# Patient Record
Sex: Male | Born: 1965 | Race: White | Hispanic: No | Marital: Married | Smoking: Never smoker
Health system: Southern US, Community
[De-identification: ages and names within clinical notes are randomized; demographics above are authoritative.]

## PROBLEM LIST (undated history)

## (undated) DIAGNOSIS — G40209 Localization-related (focal) (partial) symptomatic epilepsy and epileptic syndromes with complex partial seizures, not intractable, without status epilepticus: Secondary | ICD-10-CM

## (undated) DIAGNOSIS — T7840XA Allergy, unspecified, initial encounter: Secondary | ICD-10-CM

## (undated) DIAGNOSIS — E119 Type 2 diabetes mellitus without complications: Secondary | ICD-10-CM

## (undated) DIAGNOSIS — G43909 Migraine, unspecified, not intractable, without status migrainosus: Secondary | ICD-10-CM

## (undated) DIAGNOSIS — G4730 Sleep apnea, unspecified: Secondary | ICD-10-CM

## (undated) DIAGNOSIS — I219 Acute myocardial infarction, unspecified: Secondary | ICD-10-CM

## (undated) DIAGNOSIS — F419 Anxiety disorder, unspecified: Secondary | ICD-10-CM

## (undated) DIAGNOSIS — F32A Depression, unspecified: Secondary | ICD-10-CM

## (undated) DIAGNOSIS — K219 Gastro-esophageal reflux disease without esophagitis: Secondary | ICD-10-CM

## (undated) DIAGNOSIS — E785 Hyperlipidemia, unspecified: Secondary | ICD-10-CM

## (undated) HISTORY — DX: Acute myocardial infarction, unspecified: I21.9

## (undated) HISTORY — DX: Localization-related (focal) (partial) symptomatic epilepsy and epileptic syndromes with complex partial seizures, not intractable, without status epilepticus: G40.209

## (undated) HISTORY — DX: Anxiety disorder, unspecified: F41.9

## (undated) HISTORY — PX: SHOULDER SURGERY: SHX246

## (undated) HISTORY — PX: COLONOSCOPY: SHX174

## (undated) HISTORY — DX: Allergy, unspecified, initial encounter: T78.40XA

## (undated) HISTORY — DX: Migraine, unspecified, not intractable, without status migrainosus: G43.909

## (undated) HISTORY — DX: Depression, unspecified: F32.A

---

## 1988-08-16 DIAGNOSIS — Z77118 Contact with and (suspected) exposure to other environmental pollution: Secondary | ICD-10-CM | POA: Insufficient documentation

## 2012-01-17 DIAGNOSIS — F5221 Male erectile disorder: Secondary | ICD-10-CM | POA: Insufficient documentation

## 2012-01-17 DIAGNOSIS — N529 Male erectile dysfunction, unspecified: Secondary | ICD-10-CM | POA: Insufficient documentation

## 2013-10-27 DIAGNOSIS — M25522 Pain in left elbow: Secondary | ICD-10-CM | POA: Insufficient documentation

## 2013-10-27 DIAGNOSIS — M25512 Pain in left shoulder: Secondary | ICD-10-CM

## 2013-10-27 HISTORY — DX: Pain in left elbow: M25.522

## 2013-10-27 HISTORY — DX: Pain in left shoulder: M25.512

## 2013-12-10 DIAGNOSIS — M7542 Impingement syndrome of left shoulder: Secondary | ICD-10-CM | POA: Insufficient documentation

## 2014-03-30 DIAGNOSIS — Z9889 Other specified postprocedural states: Secondary | ICD-10-CM | POA: Insufficient documentation

## 2014-07-28 ENCOUNTER — Emergency Department (HOSPITAL_COMMUNITY): Payer: Managed Care, Other (non HMO)

## 2014-07-28 ENCOUNTER — Encounter (HOSPITAL_COMMUNITY): Payer: Self-pay | Admitting: Neurology

## 2014-07-28 ENCOUNTER — Emergency Department (HOSPITAL_COMMUNITY)
Admission: EM | Admit: 2014-07-28 | Discharge: 2014-07-28 | Disposition: A | Discharge status: Home / Self Care | Payer: Managed Care, Other (non HMO) | Attending: Emergency Medicine | Admitting: Emergency Medicine

## 2014-07-28 DIAGNOSIS — R51 Headache: Secondary | ICD-10-CM | POA: Diagnosis not present

## 2014-07-28 DIAGNOSIS — Z8639 Personal history of other endocrine, nutritional and metabolic disease: Secondary | ICD-10-CM | POA: Diagnosis not present

## 2014-07-28 DIAGNOSIS — R42 Dizziness and giddiness: Secondary | ICD-10-CM | POA: Diagnosis present

## 2014-07-28 DIAGNOSIS — Z880 Allergy status to penicillin: Secondary | ICD-10-CM | POA: Diagnosis not present

## 2014-07-28 DIAGNOSIS — R260 Ataxic gait: Secondary | ICD-10-CM | POA: Insufficient documentation

## 2014-07-28 DIAGNOSIS — E119 Type 2 diabetes mellitus without complications: Secondary | ICD-10-CM | POA: Insufficient documentation

## 2014-07-28 DIAGNOSIS — Z8719 Personal history of other diseases of the digestive system: Secondary | ICD-10-CM | POA: Diagnosis not present

## 2014-07-28 HISTORY — DX: Gastro-esophageal reflux disease without esophagitis: K21.9

## 2014-07-28 HISTORY — DX: Type 2 diabetes mellitus without complications: E11.9

## 2014-07-28 HISTORY — DX: Hyperlipidemia, unspecified: E78.5

## 2014-07-28 LAB — COMPREHENSIVE METABOLIC PANEL
ALBUMIN: 4.3 g/dL (ref 3.5–5.0)
ALK PHOS: 85 U/L (ref 38–126)
ALT: 8 U/L — AB (ref 17–63)
ANION GAP: 11 (ref 5–15)
AST: 14 U/L — ABNORMAL LOW (ref 15–41)
BILIRUBIN TOTAL: 0.3 mg/dL (ref 0.3–1.2)
BUN: 12 mg/dL (ref 6–20)
CO2: 24 mmol/L (ref 22–32)
CREATININE: 0.84 mg/dL (ref 0.61–1.24)
Calcium: 10 mg/dL (ref 8.9–10.3)
Chloride: 103 mmol/L (ref 101–111)
GFR calc Af Amer: >60 mL/min (ref >60)
GFR calc non Af Amer: >60 mL/min (ref >60)
GLUCOSE: 147 mg/dL — AB (ref 65–99)
POTASSIUM: 4.3 mmol/L (ref 3.5–5.1)
Sodium: 138 mmol/L (ref 135–145)
Total Protein: 7.5 g/dL (ref 6.5–8.1)

## 2014-07-28 LAB — CBC
HCT: 42.8 % (ref 39.0–52.0)
Hemoglobin: 14.6 g/dL (ref 13.0–17.0)
MCH: 30.3 pg (ref 26.0–34.0)
MCHC: 34.1 g/dL (ref 30.0–36.0)
MCV: 88.8 fL (ref 78.0–100.0)
PLATELETS: 277 10*3/uL (ref 150–400)
RBC: 4.82 MIL/uL (ref 4.22–5.81)
RDW: 12.3 % (ref 11.5–15.5)
WBC: 7.4 10*3/uL (ref 4.0–10.5)

## 2014-07-28 LAB — CBG MONITORING, ED: GLUCOSE-CAPILLARY: 135 mg/dL — AB (ref 65–99)

## 2014-07-28 LAB — I-STAT TROPONIN, ED: Troponin i, poc: 0 ng/mL (ref 0.00–0.08)

## 2014-07-28 NOTE — Discharge Instructions (Signed)

## 2014-07-28 NOTE — ED Provider Notes (Signed)
CSN: [id number]     Arrival date & time 07/28/14  1603 History   First MD Initiated Contact with Patient 07/28/14 1822     Chief Complaint  Patient presents with  . Dizziness    (Consider location/radiation/quality/duration/timing/severity/associated sxs/prior Treatment) Patient is a 49 y.o. male presenting with dizziness. The history is provided by the patient.  Dizziness Quality:  Lightheadedness and room spinning Severity:  Mild Onset quality:  Gradual Timing:  Intermittent Progression:  Waxing and waning Chronicity:  New Context: inactivity   Relieved by:  Nothing Worsened by:  Nothing Ineffective treatments:  None tried Associated symptoms: headaches   Associated symptoms: no blood in stool, no chest pain, no hearing loss, no nausea, no palpitations, no shortness of breath, no syncope, no vision changes, no vomiting and no weakness   Risk factors: no heart disease, no hx of stroke, no hx of vertigo and no new medications     Past Medical History  Diagnosis Date  . Diabetes mellitus without complication   . GERD (gastroesophageal reflux disease)   . Hyperlipemia    History reviewed. No pertinent past surgical history. No family history on file. History  Substance Use Topics  . Smoking status: Never Smoker   . Smokeless tobacco: Not on file  . Alcohol Use: No    Review of Systems  Constitutional: Negative for fever and fatigue.  HENT: Negative for hearing loss.   Eyes: Negative for visual disturbance.  Respiratory: Negative for chest tightness and shortness of breath.   Cardiovascular: Negative for chest pain, palpitations and syncope.  Gastrointestinal: Negative for nausea, vomiting, abdominal pain and blood in stool.  Musculoskeletal: Positive for gait problem. Negative for myalgias and neck pain.  Neurological: Positive for dizziness and headaches. Negative for weakness and light-headedness.  Psychiatric/Behavioral: Negative for confusion.  All other systems  reviewed and are negative.     Allergies  Doxycycline; Penicillins; and Tetanus toxoids  Home Medications   Prior to Admission medications   Not on File   BP 124/79 mmHg  Pulse 89  Temp(Src) 98.2 F (36.8 C) (Oral)  Resp 16  SpO2 96%   Physical Exam  Constitutional: He is oriented to person, place, and time. He appears well-developed and well-nourished. He does not appear ill. No distress.  HENT:  Head: Normocephalic and atraumatic.  Nose: Nose normal.  Mouth/Throat: Oropharynx is clear and moist. No oropharyngeal exudate.  Eyes: EOM are normal. Pupils are equal, round, and reactive to light.  No nystagmus at rest or with provocative maneuvers   Neck: Normal range of motion. Neck supple.  Cardiovascular: Normal rate, regular rhythm, normal heart sounds and intact distal pulses.   No murmur heard. Pulmonary/Chest: Effort normal and breath sounds normal. No respiratory distress. He has no wheezes. He exhibits no tenderness.  Abdominal: Soft. He exhibits no distension. There is no tenderness. There is no guarding.  Musculoskeletal: Normal range of motion. He exhibits no tenderness.  Neurological: He is alert and oriented to person, place, and time. No cranial nerve deficit. Coordination normal.  Patient is awake, alert, and oriented x 3.  Clear and fluent speech without deficits.  No facial droop.  No nystagmus.  No reproducible dizziness with shaking head or with rapid eye movement.  Full strength, sensation, coordination (rapid alternating hand movements, rapid finger movements, finger to nose, and heel to shin testing).  Stable gait.    Skin: Skin is warm and dry. He is not diaphoretic. No pallor.  Psychiatric: He has a  normal mood and affect. His behavior is normal. Judgment and thought content normal.  Nursing note and vitals reviewed.   ED Course  Procedures (including critical care time) Labs Review Labs Reviewed  COMPREHENSIVE METABOLIC PANEL - Abnormal; Notable for  the following:    Glucose, Bld 147 (*)    AST 14 (*)    ALT 8 (*)    All other components within normal limits  CBG MONITORING, ED - Abnormal; Notable for the following:    Glucose-Capillary 135 (*)    All other components within normal limits  CBC  I-STAT TROPOININ, ED    Imaging Review No results found.   EKG Interpretation   Date/Time:  Tuesday July 28 2014 16:43:31 EDT Ventricular Rate:  81 PR Interval:  144 QRS Duration: 74 QT Interval:  354 QTC Calculation: 411 R Axis:   60 Text Interpretation:  Normal sinus rhythm Normal ECG Sinus rhythm Normal  ECG Confirmed by Carmin Muskrat  MD ([id number]) on 07/28/2014 4:57:05 PM      MDM   Final diagnoses:  Ataxic gait  Lightheadedness   Pt is a 49 yo M with hx of HTN and DM who presents with several days of dizziness, lightheadedness and feeling mentally foggy.  Was at the [place] with family 4 days ago when he developed acute onset of lightheadedness and felt diffusely fatigued.  Has had intermittent "head tightness" but denies frank headaches with the sx.  No chest pain, palpitations, SOB, vision changes, or nausea.  He denies EtOH or drug use prior, no recent meds, no recent trauma, no hx of significant falls.  He took a long nap and the sensation resolved for a while but has been waxing and waning for the few days since.  He also endorses intermittent room spinning dizziness with walking and ataxic gait where he has walked into several things during this time.    Patient is awake, alert, and oriented x 3.  Able to provide his own history.  Clear and fluent speech without deficits.  No facial droop.  No nystagmus.  No reproducible dizziness with shaking head or with rapid eye movement.  Full strength, sensation, coordination (rapid alternating hand movements, rapid finger movements, finger to nose, and heel to shin testing).  Stable gait.    Due to history, will obtain CT head and labs to evaluate further.  Concerned for possible  posterior circulation stroke.  Will get CT head then MRI if needed.    CT head with no acute changes.  Minor microvascular infarcts.   Labs and EKG overall wnl.    MRI brain also benign.   Advised of the results and all questions were answered. Considered ok for dc as there is no evidence of acute intracranial pathology, vitals have been stable throughout, no evidence of arrhythmia seen, and no cardiac ischemia noted.   Exam not consistent with peripheral vertigo.  Advised good hydration and close PCP follow up.  If sx continue, may benefit from holter monitoring or further cardiac work up.  All questions were answered and ED return precautions were discussed prior to dc home in stable condition.   Labs, EKGs, and imaging were reviewed and interpreted by myself and my attending, and incorporated in the medical decision making.  Patient was seen with ED Attending, Dr. Maryjean Ka, MD    Tori Milks, MD 24/26/83 [id number]  David Glick, MD 22/29/79 8921

## 2014-07-28 NOTE — ED Notes (Signed)
Pt transported to MRI 

## 2014-07-28 NOTE — ED Notes (Addendum)
Pt is here with wife on Friday he went fishing, during the trip he was stumbling and feeling dizzy. When he got home, went to sleep in chair and doesn't remember anything that happened. Dizziness has continued, feels like he is in a box. Blood sugars have been high over weekend. Today cbg 288. Wife at bedside reports he is slurring his words. Pt is a x 4. Coming from [place] office for confusion and altered mental status but this is over weekend.

## 2014-07-28 NOTE — ED Notes (Signed)
ED Doctor at bedside.  

## 2014-07-31 ENCOUNTER — Ambulatory Visit (INDEPENDENT_AMBULATORY_CARE_PROVIDER_SITE_OTHER): Payer: Managed Care, Other (non HMO) | Admitting: Endocrinology

## 2014-07-31 ENCOUNTER — Encounter: Payer: Self-pay | Admitting: Endocrinology

## 2014-07-31 VITALS — BP 128/70 | HR 98 | Temp 97.8°F | Ht 69.5 in | Wt 223.0 lb

## 2014-07-31 DIAGNOSIS — E1142 Type 2 diabetes mellitus with diabetic polyneuropathy: Secondary | ICD-10-CM | POA: Diagnosis not present

## 2014-07-31 DIAGNOSIS — F329 Major depressive disorder, single episode, unspecified: Secondary | ICD-10-CM | POA: Diagnosis not present

## 2014-07-31 DIAGNOSIS — E785 Hyperlipidemia, unspecified: Secondary | ICD-10-CM | POA: Insufficient documentation

## 2014-07-31 DIAGNOSIS — F32A Depression, unspecified: Secondary | ICD-10-CM

## 2014-07-31 DIAGNOSIS — F339 Major depressive disorder, recurrent, unspecified: Secondary | ICD-10-CM | POA: Insufficient documentation

## 2014-07-31 MED ORDER — INSULIN DETEMIR 100 UNIT/ML FLEXPEN: 140.0000 [IU] | PEN_INJECTOR | SUBCUTANEOUS | Status: DC

## 2014-07-31 NOTE — Patient Instructions (Addendum)
good diet and exercise significantly improve the control of your diabetes.  please let me know if you wish to be referred to a dietician.  high blood sugar is very risky to your health.  you should see an eye doctor and dentist every year.  It is very important to get all recommended vaccinations.  controlling your blood pressure and cholesterol drastically reduces the damage diabetes does to your body.  Those who smoke should quit.  please discuss these with your doctor.  check your blood sugar twice a day.  vary the time of day when you check, between before the 3 meals, and at bedtime.  also check if you have symptoms of your blood sugar being too high or too low.  please keep a record of the readings and bring it to your next appointment here.  You can write it on any piece of paper.  please call us sooner if your blood sugar goes below 70, or if you have a lot of readings over 200.   For now, please stop taking the novolog, and: Take levemir, 140 units each morning.  Please call next week, to tell us how the blood sugar is doing.   Please come back for a follow-up appointment in 2 months.

## 2014-07-31 NOTE — Progress Notes (Signed)
Subjective:    Patient ID: Devin Lucero, male    DOB: [birth year], 49 y.o.   MRN: [id number]  HPI pt states DM was dx'ed in 2008; he has moderate neuropathy of the lower extremities; he is unaware of any associated chronic complications; he has been on insulin since 2010; pt says his diet and exercise are good; he has never had pancreatitis, severe hypoglycemia or DKA.  He takes levemir qhs, and says he never misses.   He averages approx 1 dose of novolog per day.  He says cbg's vary from 130-500.  It is in general higher as the day goes on.  He says he has seen multiple providers for his DM, but a1c has stayed consistently very high.  Past Medical History  Diagnosis Date  . Diabetes mellitus without complication   . GERD (gastroesophageal reflux disease)   . Hyperlipemia     No past surgical history on file.  History   Social History  . Marital Status: Married    Spouse Name: N/A  . Number of Children: N/A  . Years of Education: N/A   Occupational History  . Not on file.   Social History Main Topics  . Smoking status: Never Smoker   . Smokeless tobacco: Not on file  . Alcohol Use: No  . Drug Use: Not on file  . Sexual Activity: Not on file   Other Topics Concern  . Not on file   Social History Narrative    Current Outpatient Prescriptions on File Prior to Visit  Medication Sig Dispense Refill  . citalopram (CELEXA) 40 MG tablet Take 40 mg by mouth daily.    Marland Kitchen omeprazole (PRILOSEC) 20 MG capsule Take 20 mg by mouth daily.    . pregabalin (LYRICA) 50 MG capsule Take 50 mg by mouth 2 (two) times daily.     No current facility-administered medications on file prior to visit.    Allergies  Allergen Reactions  . Penicillins Rash  . Doxycycline Itching and Other (See Comments)    flushing  . Tetanus Toxoids Rash    Family History  Problem Relation Age of Onset  . Diabetes Paternal Grandmother     BP 128/70 mmHg  Pulse 98  Temp(Src) 97.8 F (36.6 C) (Oral)   Ht 5' 9.5" (1.765 m)  Wt 223 lb (101.152 kg)  BMI 32.47 kg/m2  SpO2 94%    Review of Systems denies weight loss, blurry vision, chest pain, sob, n/v, urinary frequency, muscle cramps, excessive diaphoresis, cold intolerance, rhinorrhea, and easy bruising.  He has chronic headache and depression.     Objective:   Physical Exam VS: see vs page GEN: no distress HEAD: head: no deformity eyes: no periorbital swelling, no proptosis external nose and ears are normal mouth: no lesion seen NECK: supple, thyroid is not enlarged CHEST WALL: no deformity LUNGS: clear to auscultation. BREASTS:  No gynecomastia. CV: reg rate and rhythm, no murmur. ABD: abdomen is soft, nontender.  no hepatosplenomegaly.  not distended.  Large self-reducing ventral hernia.  MUSCULOSKELETAL: muscle bulk and strength are grossly normal.  no obvious joint swelling.  gait is normal and steady EXTEMITIES: no deformity.  no ulcer on the feet.  feet are of normal color and temp.  Trace bilat leg edema PULSES: dorsalis pedis intact bilat.  no carotid bruit NEURO:  cn 2-12 grossly intact.   readily moves all 4's.  sensation is intact to touch on the feet, but decreased from normal SKIN:  Normal texture  and temperature.  No rash or suspicious lesion is visible.   NODES:  None palpable at the neck PSYCH: alert, well-oriented.  Does not appear anxious nor depressed.   i have reviewed outside records: pt was seen on 07/03/14.  Glycemic control was poor, and insulin pump rx is considered.  Labs: a1c=12.2%  i personally reviewed electrocardiogram tracing (07/28/14): normal    Assessment & Plan:  DM: severe exacerbation.  In this setting, he should have achievable goals for glycemic control, and a simple insulin schedule, at least for now.  Obesity: new to me.  this complicates the rx of DM.  Patient is advised the following: Patient Instructions  good diet and exercise significantly improve the control of your  diabetes.  please let me know if you wish to be referred to a dietician.  high blood sugar is very risky to your health.  you should see an eye doctor and dentist every year.  It is very important to get all recommended vaccinations.  controlling your blood pressure and cholesterol drastically reduces the damage diabetes does to your body.  Those who smoke should quit.  please discuss these with your doctor.  check your blood sugar twice a day.  vary the time of day when you check, between before the 3 meals, and at bedtime.  also check if you have symptoms of your blood sugar being too high or too low.  please keep a record of the readings and bring it to your next appointment here.  You can write it on any piece of paper.  please call us sooner if your blood sugar goes below 70, or if you have a lot of readings over 200.   For now, please stop taking the novolog, and: Take levemir, 140 units each morning.  Please call next week, to tell us how the blood sugar is doing.   Please come back for a follow-up appointment in 2 months.

## 2014-08-04 ENCOUNTER — Telehealth: Payer: Self-pay | Admitting: Endocrinology

## 2014-08-04 NOTE — Telephone Encounter (Signed)
Did the pt state when his readings were high? Specific meals or days?

## 2014-08-04 NOTE — Telephone Encounter (Signed)
No he did not

## 2014-08-04 NOTE — Telephone Encounter (Signed)
Pt's BS levels are staying level but high at 220-250

## 2014-09-17 ENCOUNTER — Telehealth: Payer: Self-pay | Admitting: Endocrinology

## 2014-09-18 NOTE — Telephone Encounter (Signed)
error 

## 2014-10-02 ENCOUNTER — Encounter: Payer: Self-pay | Admitting: Endocrinology

## 2014-10-02 ENCOUNTER — Ambulatory Visit (INDEPENDENT_AMBULATORY_CARE_PROVIDER_SITE_OTHER): Payer: Managed Care, Other (non HMO) | Admitting: Endocrinology

## 2014-10-02 VITALS — BP 137/88 | HR 76 | Temp 98.2°F | Ht 69.5 in | Wt 222.0 lb

## 2014-10-02 DIAGNOSIS — E1142 Type 2 diabetes mellitus with diabetic polyneuropathy: Secondary | ICD-10-CM

## 2014-10-02 LAB — POCT GLYCOSYLATED HEMOGLOBIN (HGB A1C): Hemoglobin A1C: 11.2

## 2014-10-02 MED ORDER — INSULIN DETEMIR 100 UNIT/ML FLEXPEN: 170.0000 [IU] | PEN_INJECTOR | SUBCUTANEOUS | Status: DC

## 2014-10-02 NOTE — Patient Instructions (Addendum)
check your blood sugar twice a day.  vary the time of day when you check, between before the 3 meals, and at bedtime.  also check if you have symptoms of your blood sugar being too high or too low.  please keep a record of the readings and bring it to your next appointment here.  You can write it on any piece of paper.  please call us sooner if your blood sugar goes below 70, or if you have a lot of readings over 200.   Please increase the levemir to 170 units each morning.  Please come back for a follow-up appointment in 2 months.

## 2014-10-02 NOTE — Progress Notes (Signed)
   Subjective:    Patient ID: Devin Lucero, male    DOB: 06/30/1965, 49 y.o.   MRN: [id number]  HPI Pt returns for f/u of diabetes mellitus: DM type: Insulin-requiring type 2 Dx'ed: 8756 Complications: polyneuropathy Therapy: insulin since 2010 DKA: never Severe hypoglycemia: never Pancreatitis: never Other: he needs a qd insulin regimen, as he has had poor results with multiple daily injections in the past.   Interval history: no cbg record, but states cbg's are in the 139-200's.  It is in general higher as the day goes on.  pt states he feels well in general.   Past Medical History  Diagnosis Date  . Diabetes mellitus without complication   . GERD (gastroesophageal reflux disease)   . Hyperlipemia     No past surgical history on file.  Social History   Social History  . Marital Status: Married    Spouse Name: N/A  . Number of Children: N/A  . Years of Education: N/A   Occupational History  . Not on file.   Social History Main Topics  . Smoking status: Never Smoker   . Smokeless tobacco: Not on file  . Alcohol Use: No  . Drug Use: Not on file  . Sexual Activity: Not on file   Other Topics Concern  . Not on file   Social History Narrative    Current Outpatient Prescriptions on File Prior to Visit  Medication Sig Dispense Refill  . citalopram (CELEXA) 40 MG tablet Take 40 mg by mouth daily.    Marland Kitchen omeprazole (PRILOSEC) 20 MG capsule Take 20 mg by mouth daily.    . pregabalin (LYRICA) 50 MG capsule Take 50 mg by mouth 2 (two) times daily.     No current facility-administered medications on file prior to visit.    Allergies  Allergen Reactions  . Penicillins Rash  . Doxycycline Itching and Other (See Comments)    flushing  . Tetanus Toxoids Rash    Family History  Problem Relation Age of Onset  . Diabetes Paternal Grandmother     BP 137/88 mmHg  Pulse 76  Temp(Src) 98.2 F (36.8 C) (Oral)  Ht 5' 9.5" (1.765 m)  Wt 222 lb (100.699 kg)  BMI 32.32  kg/m2  SpO2 97%  Review of Systems He denies hypoglycemia    Objective:   Physical Exam VITAL SIGNS:  See vs page GENERAL: no distress Pulses: dorsalis pedis intact bilat.   MSK: no deformity of the feet CV: no leg edema Skin:  no ulcer on the feet.  normal color and temp on the feet. Neuro: sensation is intact to touch on the feet    A1c=11.2%    Assessment & Plan:  DM: he needs increased rx.  Patient is advised the following: Patient Instructions  check your blood sugar twice a day.  vary the time of day when you check, between before the 3 meals, and at bedtime.  also check if you have symptoms of your blood sugar being too high or too low.  please keep a record of the readings and bring it to your next appointment here.  You can write it on any piece of paper.  please call us sooner if your blood sugar goes below 70, or if you have a lot of readings over 200.   Please increase the levemir to 170 units each morning.  Please come back for a follow-up appointment in 2 months.

## 2014-10-23 ENCOUNTER — Telehealth: Payer: Self-pay | Admitting: Endocrinology

## 2014-10-23 MED ORDER — INSULIN ISOPHANE HUMAN 100 UNIT/ML KWIKPEN: 130.0000 [IU] | PEN_INJECTOR | SUBCUTANEOUS | Status: DC

## 2014-10-23 NOTE — Telephone Encounter (Signed)
See note below and please advise, Thanks! 

## 2014-10-23 NOTE — Telephone Encounter (Signed)
i have sent a prescription to your pharmacy, to change to NPH. This is not a unit-for-unit conversion, so this is probably not enough Please call next week to report cbg's

## 2014-10-23 NOTE — Telephone Encounter (Signed)
Pt letting us know the injection spots where he gives himself the insulin is red and has fever in it

## 2014-10-26 NOTE — Telephone Encounter (Signed)
Patient advised of note below and voiced understanding.  

## 2014-12-04 ENCOUNTER — Ambulatory Visit (INDEPENDENT_AMBULATORY_CARE_PROVIDER_SITE_OTHER): Payer: Managed Care, Other (non HMO) | Admitting: Endocrinology

## 2014-12-04 ENCOUNTER — Encounter: Payer: Self-pay | Admitting: Endocrinology

## 2014-12-04 VITALS — BP 118/84 | HR 82 | Temp 98.7°F | Ht 69.5 in | Wt 229.0 lb

## 2014-12-04 DIAGNOSIS — E1142 Type 2 diabetes mellitus with diabetic polyneuropathy: Secondary | ICD-10-CM

## 2014-12-04 LAB — POCT GLYCOSYLATED HEMOGLOBIN (HGB A1C): Hemoglobin A1C: 11.2

## 2014-12-04 MED ORDER — INSULIN ISOPHANE HUMAN 100 UNIT/ML KWIKPEN: 160.0000 [IU] | PEN_INJECTOR | SUBCUTANEOUS | Status: DC

## 2014-12-04 NOTE — Progress Notes (Signed)
Subjective:    Patient ID: Devin Lucero, male    DOB: March 03, 1965, 49 y.o.   MRN: [id number]  HPI Pt returns for f/u of diabetes mellitus: DM type: Insulin-requiring type 2 Dx'ed: [id number] Complications: polyneuropathy Therapy: insulin since 2010.  DKA: never Severe hypoglycemia: never Pancreatitis: never Other: he needs a qd insulin regimen, as he has had poor results with multiple daily injections in the past.  When he took levemir, he had am hypoglycemia and pm hyperglycemia.   Interval history: no cbg record, but states cbg's are in the 150-200's.  It is in general higher as the day goes on.  pt states he feels well in general.  He takes 130 units qam.  Past Medical History  Diagnosis Date  . Diabetes mellitus without complication ([place])   . GERD (gastroesophageal reflux disease)   . Hyperlipemia     No past surgical history on file.  Social History   Social History  . Marital Status: Married    Spouse Name: N/A  . Number of Children: N/A  . Years of Education: N/A   Occupational History  . Not on file.   Social History Main Topics  . Smoking status: Never Smoker   . Smokeless tobacco: Not on file  . Alcohol Use: No  . Drug Use: Not on file  . Sexual Activity: Not on file   Other Topics Concern  . Not on file   Social History Narrative    Current Outpatient Prescriptions on File Prior to Visit  Medication Sig Dispense Refill  . citalopram (CELEXA) 40 MG tablet Take 40 mg by mouth daily.    Marland Kitchen omeprazole (PRILOSEC) 20 MG capsule Take 20 mg by mouth daily.    . pregabalin (LYRICA) 50 MG capsule Take 50 mg by mouth 2 (two) times daily.     No current facility-administered medications on file prior to visit.    Allergies  Allergen Reactions  . Penicillins Rash  . Doxycycline Itching and Other (See Comments)    flushing  . Tetanus Toxoids Rash    Family History  Problem Relation Age of Onset  . Diabetes Paternal Grandmother     BP 118/84 mmHg  Pulse  82  Temp(Src) 98.7 F (37.1 C) (Oral)  Ht 5' 9.5" (1.765 m)  Wt 229 lb (103.874 kg)  BMI 33.34 kg/m2  SpO2 96%  Review of Systems He denies hypoglycemia and weight change    Objective:   Physical Exam VITAL SIGNS:  See vs page GENERAL: no distress SKIN:  Insulin injection sites at the anterior abdomen are normal.    a1c=11.2%    Assessment & Plan:  DM: he needs increased rx.  Patient is advised the following: Patient Instructions  check your blood sugar twice a day.  vary the time of day when you check, between before the 3 meals, and at bedtime.  also check if you have symptoms of your blood sugar being too high or too low.  please keep a record of the readings and bring it to your next appointment here.  You can write it on any piece of paper.  please call us sooner if your blood sugar goes below 70, or if you have a lot of readings over 200.   Please increase the NPH insulin to 160 units each morning.  Please call in 2 weeks, to tell us how the blood sugar is doing.   Please come back for a follow-up appointment in 2 months.

## 2014-12-04 NOTE — Patient Instructions (Addendum)
check your blood sugar twice a day.  vary the time of day when you check, between before the 3 meals, and at bedtime.  also check if you have symptoms of your blood sugar being too high or too low.  please keep a record of the readings and bring it to your next appointment here.  You can write it on any piece of paper.  please call us sooner if your blood sugar goes below 70, or if you have a lot of readings over 200.   Please increase the NPH insulin to 160 units each morning.  Please call in 2 weeks, to tell us how the blood sugar is doing.   Please come back for a follow-up appointment in 2 months.

## 2014-12-08 ENCOUNTER — Telehealth: Payer: Self-pay | Admitting: Endocrinology

## 2014-12-08 MED ORDER — GLUCOSE BLOOD VI STRP: ORAL_STRIP | Status: DC

## 2014-12-08 NOTE — Telephone Encounter (Signed)
Patient need a refill of test strips, for the meter one touch ultra, send to  [facility] - [place], [place] [phone] (Phone) [phone] (Fax)

## 2014-12-08 NOTE — Telephone Encounter (Signed)
rx submitted per pt's request.  

## 2015-02-05 ENCOUNTER — Ambulatory Visit: Payer: Managed Care, Other (non HMO) | Admitting: Endocrinology

## 2015-02-19 ENCOUNTER — Ambulatory Visit: Payer: Managed Care, Other (non HMO) | Admitting: Endocrinology

## 2015-03-05 ENCOUNTER — Ambulatory Visit: Payer: Managed Care, Other (non HMO) | Admitting: Endocrinology

## 2015-03-12 ENCOUNTER — Ambulatory Visit (INDEPENDENT_AMBULATORY_CARE_PROVIDER_SITE_OTHER): Payer: Managed Care, Other (non HMO) | Admitting: Endocrinology

## 2015-03-12 ENCOUNTER — Encounter: Payer: Self-pay | Admitting: Endocrinology

## 2015-03-12 VITALS — BP 146/82 | HR 77 | Temp 98.2°F | Wt 229.0 lb

## 2015-03-12 DIAGNOSIS — E119 Type 2 diabetes mellitus without complications: Secondary | ICD-10-CM

## 2015-03-12 DIAGNOSIS — E1142 Type 2 diabetes mellitus with diabetic polyneuropathy: Secondary | ICD-10-CM | POA: Diagnosis not present

## 2015-03-12 DIAGNOSIS — Z794 Long term (current) use of insulin: Secondary | ICD-10-CM

## 2015-03-12 LAB — POCT GLYCOSYLATED HEMOGLOBIN (HGB A1C): Hemoglobin A1C: 12

## 2015-03-12 MED ORDER — INSULIN LISPRO PROT & LISPRO (75-25 MIX) 100 UNIT/ML KWIKPEN: 160.0000 [IU] | PEN_INJECTOR | SUBCUTANEOUS | Status: DC

## 2015-03-12 NOTE — Patient Instructions (Addendum)
check your blood sugar twice a day.  vary the time of day when you check, between before the 3 meals, and at bedtime.  also check if you have symptoms of your blood sugar being too high or too low.  please keep a record of the readings and bring it to your next appointment here.  You can write it on any piece of paper.  please call us sooner if your blood sugar goes below 70, or if you have a lot of readings over 200.   Please change the NPH insulin to "75/25," 160 units with breakfast.  i have sent a prescription to your pharmacy.   On this type of insulin schedule, you should eat meals on a regular schedule.  If a meal is missed or significantly delayed, your blood sugar could go low. Please come back for a follow-up appointment in 2 months.

## 2015-03-12 NOTE — Progress Notes (Signed)
Pre visit review using our clinic review tool, if applicable. No additional management support is needed unless otherwise documented below in the visit note. 

## 2015-03-12 NOTE — Progress Notes (Signed)
Subjective:    Patient ID: Devin Lucero, male    DOB: [birth year], 50 y.o.   MRN: [id number]  HPI Pt returns for f/u of diabetes mellitus: DM type: Insulin-requiring type 2 Dx'ed: [id number] Complications: polyneuropathy Therapy: insulin since 2010.  DKA: never Severe hypoglycemia: never Pancreatitis: never Other: he needs a qd insulin regimen, as he has had poor results with multiple daily injections in the past.  When he took levemir, he had am hypoglycemia and pm hyperglycemia.   Interval history: Pt says he had to go to [place] for the past week on a family emergency, and did not take his insulin then.  Prior to the trip, he says cbg's were in the low-100's.  He had only 1 episode of hypoglycemia (54).  This was fasting.   Past Medical History  Diagnosis Date  . Diabetes mellitus without complication ([place])   . GERD (gastroesophageal reflux disease)   . Hyperlipemia     No past surgical history on file.  Social History   Social History  . Marital Status: Married    Spouse Name: N/A  . Number of Children: N/A  . Years of Education: N/A   Occupational History  . Not on file.   Social History Main Topics  . Smoking status: Never Smoker   . Smokeless tobacco: Not on file  . Alcohol Use: No  . Drug Use: Not on file  . Sexual Activity: Not on file   Other Topics Concern  . Not on file   Social History Narrative    Current Outpatient Prescriptions on File Prior to Visit  Medication Sig Dispense Refill  . glucose blood (ONE TOUCH ULTRA TEST) test strip Use to check blood sugar 2 times per day 100 each 5  . omeprazole (PRILOSEC) 20 MG capsule Take 20 mg by mouth daily.     No current facility-administered medications on file prior to visit.    Allergies  Allergen Reactions  . Penicillins Rash  . Doxycycline Itching and Other (See Comments)    flushing  . Tetanus Toxoids Rash    Family History  Problem Relation Age of Onset  . Diabetes Paternal Grandmother      BP 146/82 mmHg  Pulse 77  Temp(Src) 98.2 F (36.8 C) (Oral)  Wt 229 lb (103.874 kg)  SpO2 97%  Review of Systems Denies LOC    Objective:   Physical Exam VITAL SIGNS:  See vs page GENERAL: no distress Pulses: dorsalis pedis intact bilat.   MSK: no deformity of the feet CV: no leg edema Skin:  no ulcer on the feet.  normal color and temp on the feet. Neuro: sensation is intact to touch on the feet.     A1c=12%    Assessment & Plan:  DM: worse.    Noncompliance with cbg recording and insulin: he needs to continue insulin just QD, but the pattern of his cbg's indicates he needs a faster-acting qd insulin.     Patient is advised the following: Patient Instructions  check your blood sugar twice a day.  vary the time of day when you check, between before the 3 meals, and at bedtime.  also check if you have symptoms of your blood sugar being too high or too low.  please keep a record of the readings and bring it to your next appointment here.  You can write it on any piece of paper.  please call us sooner if your blood sugar goes below 70, or if you  have a lot of readings over 200.   Please change the NPH insulin to "75/25," 160 units with breakfast.  i have sent a prescription to your pharmacy.   On this type of insulin schedule, you should eat meals on a regular schedule.  If a meal is missed or significantly delayed, your blood sugar could go low. Please come back for a follow-up appointment in 2 months.

## 2015-03-14 DIAGNOSIS — E1169 Type 2 diabetes mellitus with other specified complication: Secondary | ICD-10-CM | POA: Insufficient documentation

## 2015-03-14 DIAGNOSIS — E1140 Type 2 diabetes mellitus with diabetic neuropathy, unspecified: Secondary | ICD-10-CM | POA: Insufficient documentation

## 2015-03-14 DIAGNOSIS — E119 Type 2 diabetes mellitus without complications: Secondary | ICD-10-CM | POA: Insufficient documentation

## 2015-03-16 ENCOUNTER — Telehealth: Payer: Self-pay | Admitting: Endocrinology

## 2015-03-16 NOTE — Telephone Encounter (Addendum)
See note below and please advise. Pt will not be able and pick his new insulin up.

## 2015-03-16 NOTE — Telephone Encounter (Signed)
Options are humalog 75/25, novolog 70/30, or [facility] 70/30. Please let me know which one is cheapest for you.

## 2015-03-16 NOTE — Telephone Encounter (Signed)
See note below and please advise, Thanks! 

## 2015-03-16 NOTE — Telephone Encounter (Signed)
I contacted the pt and advised to increase his old insulin to 180 units. Pt voiced understanding. Since the humalog is not covered under his insurance should the pt stay on the medication he is currently taking? Please advise, Thanks!

## 2015-03-16 NOTE — Telephone Encounter (Signed)
PT said he hasn't gotten new insulin yet because of insurance and the past two days his sugar levels have been around 216-356 and he also said that he has had the feeling as if he is "coming off of anesthesia" as well.

## 2015-03-16 NOTE — Telephone Encounter (Signed)
Pt states the humalog requires a prior auth please keep him updated on the status once submitted

## 2015-03-16 NOTE — Telephone Encounter (Signed)
To use up the old insulin, increase to 180 units qam

## 2015-03-17 MED ORDER — INSULIN ASPART PROT & ASPART (70-30 MIX) 100 UNIT/ML PEN: 160.0000 [IU] | PEN_INJECTOR | Freq: Every day | SUBCUTANEOUS | Status: DC

## 2015-03-17 NOTE — Telephone Encounter (Signed)
Pt will call our office back if he has any issues picking the med up.

## 2015-03-17 NOTE — Telephone Encounter (Signed)
Novolog 70/30 is the alternative brand to the humalog 75/25. Rx for novolog 70/30 has been sent in place of the humalog 75/25.

## 2015-03-17 NOTE — Addendum Note (Signed)
Addended by: Verlin Grills T on: 03/17/2015 08:43 AM   Modules accepted: Orders, Medications

## 2015-05-07 ENCOUNTER — Ambulatory Visit: Payer: Managed Care, Other (non HMO) | Admitting: Endocrinology

## 2015-05-21 ENCOUNTER — Ambulatory Visit (INDEPENDENT_AMBULATORY_CARE_PROVIDER_SITE_OTHER): Payer: Managed Care, Other (non HMO) | Admitting: Endocrinology

## 2015-05-21 ENCOUNTER — Encounter: Payer: Self-pay | Admitting: Endocrinology

## 2015-05-21 ENCOUNTER — Telehealth: Payer: Self-pay | Admitting: Endocrinology

## 2015-05-21 VITALS — BP 130/82 | HR 82 | Temp 98.1°F | Resp 16 | Ht 69.5 in | Wt 225.4 lb

## 2015-05-21 DIAGNOSIS — E1142 Type 2 diabetes mellitus with diabetic polyneuropathy: Secondary | ICD-10-CM | POA: Diagnosis not present

## 2015-05-21 DIAGNOSIS — Z794 Long term (current) use of insulin: Secondary | ICD-10-CM | POA: Diagnosis not present

## 2015-05-21 LAB — POCT GLYCOSYLATED HEMOGLOBIN (HGB A1C): HEMOGLOBIN A1C: 11.6

## 2015-05-21 MED ORDER — INSULIN ISOPHANE HUMAN 100 UNIT/ML KWIKPEN: 140.0000 [IU] | PEN_INJECTOR | SUBCUTANEOUS | Status: DC

## 2015-05-21 MED ORDER — INSULIN LISPRO 100 UNIT/ML (KWIKPEN): 20.0000 [IU] | PEN_INJECTOR | Freq: Every day | SUBCUTANEOUS | Status: DC

## 2015-05-21 NOTE — Telephone Encounter (Signed)
I contacted the [facility] and they stated the medication is ready for pick up and no further clarification is needed.

## 2015-05-21 NOTE — Telephone Encounter (Signed)
[facility] called to get clarification of the Humulin Claiborne Rigg that was sent in.  CB# [phone]

## 2015-05-21 NOTE — Patient Instructions (Addendum)
check your blood sugar twice a day.  vary the time of day when you check, between before the 3 meals, and at bedtime.  also check if you have symptoms of your blood sugar being too high or too low.  please keep a record of the readings and bring it to your next appointment here.  You can write it on any piece of paper.  please call us sooner if your blood sugar goes below 70, or if you have a lot of readings over 200.   Please change the "75/25" insulin to 2 different ones, both with breakfast: humalog 20 units, and NPH, 140 units.  i have sent prescriptions to your pharmacy.   On this type of insulin schedule, you should eat meals on a regular schedule.  If a meal is missed or significantly delayed, your blood sugar could go low. Please come back for a follow-up appointment in 1 month.

## 2015-05-21 NOTE — Progress Notes (Signed)
Pre visit review using our clinic review tool, if applicable. No additional management support is needed unless otherwise documented below in the visit note. 

## 2015-05-21 NOTE — Progress Notes (Signed)
Subjective:    Patient ID: Devin Lucero, male    DOB: 1965-07-19, 50 y.o.   MRN: [id number]  HPI Pt returns for f/u of diabetes mellitus: DM type: Insulin-requiring type 2 Dx'ed: [id number] Complications: polyneuropathy Therapy: insulin since 2010.  DKA: never Severe hypoglycemia: never.  Pancreatitis: never Other: he needs a qd insulin regimen, as he has had poor results with multiple daily injections in the past.  When he took levemir, he had am hypoglycemia and pm hyperglycemia.   Interval history: no cbg record, but states cbg's vary from 52-300's, but most are over 200.  It is lowest at lunch.  However, It is in general highest in the afternoon.   Past Medical History  Diagnosis Date  . Diabetes mellitus without complication ([place])   . GERD (gastroesophageal reflux disease)   . Hyperlipemia     No past surgical history on file.  Social History   Social History  . Marital Status: Married    Spouse Name: N/A  . Number of Children: N/A  . Years of Education: N/A   Occupational History  . Not on file.   Social History Main Topics  . Smoking status: Never Smoker   . Smokeless tobacco: Not on file  . Alcohol Use: No  . Drug Use: Not on file  . Sexual Activity: Not on file   Other Topics Concern  . Not on file   Social History Narrative    Current Outpatient Prescriptions on File Prior to Visit  Medication Sig Dispense Refill  . citalopram (CELEXA) 20 MG tablet Take 20 mg by mouth daily.    Marland Kitchen glucose blood (ONE TOUCH ULTRA TEST) test strip Use to check blood sugar 2 times per day 100 each 5  . LYRICA 300 MG capsule Take 3 capsules by mouth daily.  1  . omeprazole (PRILOSEC) 20 MG capsule Take 20 mg by mouth daily.    Marland Kitchen omeprazole (PRILOSEC) 40 MG capsule Take 1 capsule by mouth at bedtime.    . prazosin (MINIPRESS) 2 MG capsule Take 2 mg by mouth at bedtime.     No current facility-administered medications on file prior to visit.    Allergies  Allergen  Reactions  . Penicillins Rash  . Doxycycline Itching and Other (See Comments)    flushing  . Tetanus Toxoids Rash    Family History  Problem Relation Age of Onset  . Diabetes Paternal Grandmother     BP 130/82 mmHg  Pulse 82  Temp(Src) 98.1 F (36.7 C) (Oral)  Resp 16  Ht 5' 9.5" (1.765 m)  Wt 225 lb 6.4 oz (102.241 kg)  BMI 32.82 kg/m2  SpO2 96%  Review of Systems He has lost 4 lbs since last ov.      Objective:   Physical Exam VITAL SIGNS:  See vs page GENERAL: no distress Pulses: dorsalis pedis intact bilat.   MSK: no deformity of the feet.  CV: no leg edema.  Skin:  no ulcer on the feet.  normal color and temp on the feet.  Neuro: sensation is intact to touch on the feet.     Lab Results  Component Value Date   HGBA1C 11.6 05/21/2015       Assessment & Plan:  DM: The pattern of his cbg's indicates he needs less humalog than is in the premixed insulin.  Patient is advised the following: Patient Instructions  check your blood sugar twice a day.  vary the time of day when you check,  between before the 3 meals, and at bedtime.  also check if you have symptoms of your blood sugar being too high or too low.  please keep a record of the readings and bring it to your next appointment here.  You can write it on any piece of paper.  please call us sooner if your blood sugar goes below 70, or if you have a lot of readings over 200.   Please change the "75/25" insulin to 2 different ones, both with breakfast: humalog 20 units, and NPH, 140 units.  i have sent prescriptions to your pharmacy.   On this type of insulin schedule, you should eat meals on a regular schedule.  If a meal is missed or significantly delayed, your blood sugar could go low. Please come back for a follow-up appointment in 1 month.

## 2015-06-25 ENCOUNTER — Ambulatory Visit: Payer: Managed Care, Other (non HMO) | Admitting: Endocrinology

## 2017-01-12 DIAGNOSIS — M21611 Bunion of right foot: Secondary | ICD-10-CM | POA: Insufficient documentation

## 2017-01-12 DIAGNOSIS — E1140 Type 2 diabetes mellitus with diabetic neuropathy, unspecified: Secondary | ICD-10-CM | POA: Insufficient documentation

## 2017-01-12 DIAGNOSIS — E1142 Type 2 diabetes mellitus with diabetic polyneuropathy: Secondary | ICD-10-CM | POA: Insufficient documentation

## 2018-01-18 DIAGNOSIS — R42 Dizziness and giddiness: Secondary | ICD-10-CM

## 2018-01-18 DIAGNOSIS — E785 Hyperlipidemia, unspecified: Secondary | ICD-10-CM

## 2018-01-18 DIAGNOSIS — R200 Anesthesia of skin: Secondary | ICD-10-CM

## 2018-01-18 DIAGNOSIS — I10 Essential (primary) hypertension: Secondary | ICD-10-CM | POA: Diagnosis not present

## 2018-01-18 DIAGNOSIS — E119 Type 2 diabetes mellitus without complications: Secondary | ICD-10-CM

## 2018-01-18 DIAGNOSIS — G459 Transient cerebral ischemic attack, unspecified: Secondary | ICD-10-CM

## 2018-01-18 DIAGNOSIS — R2681 Unsteadiness on feet: Secondary | ICD-10-CM | POA: Diagnosis not present

## 2018-01-18 DIAGNOSIS — R0602 Shortness of breath: Secondary | ICD-10-CM | POA: Diagnosis not present

## 2018-01-19 DIAGNOSIS — R42 Dizziness and giddiness: Secondary | ICD-10-CM | POA: Diagnosis not present

## 2018-01-19 DIAGNOSIS — R2681 Unsteadiness on feet: Secondary | ICD-10-CM | POA: Diagnosis not present

## 2018-01-19 DIAGNOSIS — R200 Anesthesia of skin: Secondary | ICD-10-CM | POA: Diagnosis not present

## 2018-01-19 DIAGNOSIS — G459 Transient cerebral ischemic attack, unspecified: Secondary | ICD-10-CM | POA: Diagnosis not present

## 2020-01-02 ENCOUNTER — Other Ambulatory Visit: Payer: Self-pay | Admitting: Orthopedic Surgery

## 2020-01-02 ENCOUNTER — Other Ambulatory Visit (HOSPITAL_COMMUNITY): Payer: Self-pay | Admitting: Orthopedic Surgery

## 2020-01-02 DIAGNOSIS — M7501 Adhesive capsulitis of right shoulder: Secondary | ICD-10-CM

## 2020-01-12 ENCOUNTER — Ambulatory Visit (HOSPITAL_COMMUNITY): Admission: RE | Admit: 2020-01-12 | Payer: Managed Care, Other (non HMO) | Source: Ambulatory Visit

## 2020-01-19 ENCOUNTER — Other Ambulatory Visit: Payer: Self-pay

## 2020-01-19 ENCOUNTER — Ambulatory Visit (HOSPITAL_COMMUNITY)
Admission: RE | Admit: 2020-01-19 | Discharge: 2020-01-19 | Disposition: A | Discharge status: Home / Self Care | Payer: No Typology Code available for payment source | Source: Ambulatory Visit | Attending: Orthopedic Surgery | Admitting: Orthopedic Surgery

## 2020-01-19 DIAGNOSIS — M7501 Adhesive capsulitis of right shoulder: Secondary | ICD-10-CM | POA: Insufficient documentation

## 2020-01-19 MED ORDER — BUPIVACAINE HCL (PF) 0.5 % IJ SOLN
INTRAMUSCULAR | Status: AC
  Administered 2020-01-19: 3 mL
  Filled 2020-01-19: qty 30

## 2020-01-19 MED ORDER — POVIDONE-IODINE 10 % EX SOLN
CUTANEOUS | Status: AC
  Filled 2020-01-19: qty 15

## 2020-01-19 MED ORDER — TRIAMCINOLONE ACETONIDE 40 MG/ML IJ SUSP
INTRAMUSCULAR | Status: AC
  Administered 2020-01-19: 40 mg
  Filled 2020-01-19: qty 1

## 2020-01-19 MED ORDER — LIDOCAINE HCL (PF) 1 % IJ SOLN
INTRAMUSCULAR | Status: AC
  Administered 2020-01-19: 5 mL
  Filled 2020-01-19: qty 5

## 2020-01-19 MED ORDER — LIDOCAINE HCL (PF) 1 % IJ SOLN
INTRAMUSCULAR | Status: AC
  Filled 2020-01-19: qty 5

## 2020-01-19 MED ORDER — IOPAMIDOL (ISOVUE-300) INJECTION 61%
30.0000 mL | Freq: Once | INTRAVENOUS | Status: AC | PRN
  Administered 2020-01-19: 2 mL

## 2020-01-19 NOTE — Procedures (Signed)
Preprocedure Dx: Adhesive bursitis of right shoulder Postprocedure Dx: Adhesive bursitis of right shoulder Procedure  Fluoroscopically guided RT shoulder joint injection, therapeutic Radiologist:  Tyron Russell Anesthesia:  3 ml of 1% lidocaine Injectate:  40mg  Kenalog, 3 ml Marcaine Fluoro time:  0 minutes 18 seconds EBL:   None Complications: None

## 2020-12-14 DIAGNOSIS — I10 Essential (primary) hypertension: Secondary | ICD-10-CM | POA: Diagnosis not present

## 2020-12-14 DIAGNOSIS — E1140 Type 2 diabetes mellitus with diabetic neuropathy, unspecified: Secondary | ICD-10-CM | POA: Diagnosis not present

## 2020-12-14 DIAGNOSIS — E1165 Type 2 diabetes mellitus with hyperglycemia: Secondary | ICD-10-CM | POA: Diagnosis not present

## 2020-12-14 DIAGNOSIS — E11649 Type 2 diabetes mellitus with hypoglycemia without coma: Secondary | ICD-10-CM | POA: Diagnosis not present

## 2020-12-14 DIAGNOSIS — Z23 Encounter for immunization: Secondary | ICD-10-CM | POA: Diagnosis not present

## 2021-01-25 DIAGNOSIS — E1143 Type 2 diabetes mellitus with diabetic autonomic (poly)neuropathy: Secondary | ICD-10-CM | POA: Diagnosis not present

## 2021-01-25 DIAGNOSIS — E119 Type 2 diabetes mellitus without complications: Secondary | ICD-10-CM | POA: Diagnosis not present

## 2021-01-25 DIAGNOSIS — Z7984 Long term (current) use of oral hypoglycemic drugs: Secondary | ICD-10-CM | POA: Diagnosis not present

## 2021-01-25 DIAGNOSIS — Z794 Long term (current) use of insulin: Secondary | ICD-10-CM | POA: Diagnosis not present

## 2021-01-25 DIAGNOSIS — E1140 Type 2 diabetes mellitus with diabetic neuropathy, unspecified: Secondary | ICD-10-CM | POA: Diagnosis not present

## 2021-01-25 DIAGNOSIS — E11649 Type 2 diabetes mellitus with hypoglycemia without coma: Secondary | ICD-10-CM | POA: Diagnosis not present

## 2021-01-25 DIAGNOSIS — E1165 Type 2 diabetes mellitus with hyperglycemia: Secondary | ICD-10-CM | POA: Diagnosis not present

## 2021-01-25 DIAGNOSIS — Z8669 Personal history of other diseases of the nervous system and sense organs: Secondary | ICD-10-CM | POA: Diagnosis not present

## 2021-01-31 DIAGNOSIS — R0781 Pleurodynia: Secondary | ICD-10-CM | POA: Diagnosis not present

## 2021-01-31 DIAGNOSIS — S20212A Contusion of left front wall of thorax, initial encounter: Secondary | ICD-10-CM | POA: Diagnosis not present

## 2021-02-15 DIAGNOSIS — G43909 Migraine, unspecified, not intractable, without status migrainosus: Secondary | ICD-10-CM | POA: Diagnosis not present

## 2021-02-15 DIAGNOSIS — H04123 Dry eye syndrome of bilateral lacrimal glands: Secondary | ICD-10-CM | POA: Diagnosis not present

## 2021-02-15 DIAGNOSIS — H5319 Other subjective visual disturbances: Secondary | ICD-10-CM | POA: Diagnosis not present

## 2021-02-15 DIAGNOSIS — E113393 Type 2 diabetes mellitus with moderate nonproliferative diabetic retinopathy without macular edema, bilateral: Secondary | ICD-10-CM | POA: Diagnosis not present

## 2021-04-12 DIAGNOSIS — E1142 Type 2 diabetes mellitus with diabetic polyneuropathy: Secondary | ICD-10-CM | POA: Diagnosis not present

## 2021-04-12 DIAGNOSIS — E119 Type 2 diabetes mellitus without complications: Secondary | ICD-10-CM | POA: Diagnosis not present

## 2021-04-12 DIAGNOSIS — F4310 Post-traumatic stress disorder, unspecified: Secondary | ICD-10-CM | POA: Diagnosis not present

## 2021-04-12 DIAGNOSIS — F32A Depression, unspecified: Secondary | ICD-10-CM | POA: Diagnosis not present

## 2021-04-20 DIAGNOSIS — G40209 Localization-related (focal) (partial) symptomatic epilepsy and epileptic syndromes with complex partial seizures, not intractable, without status epilepticus: Secondary | ICD-10-CM | POA: Diagnosis not present

## 2021-04-20 DIAGNOSIS — G43909 Migraine, unspecified, not intractable, without status migrainosus: Secondary | ICD-10-CM | POA: Diagnosis not present

## 2021-06-01 DIAGNOSIS — E1142 Type 2 diabetes mellitus with diabetic polyneuropathy: Secondary | ICD-10-CM | POA: Diagnosis not present

## 2021-06-02 DIAGNOSIS — I951 Orthostatic hypotension: Secondary | ICD-10-CM | POA: Diagnosis not present

## 2021-06-02 DIAGNOSIS — R519 Headache, unspecified: Secondary | ICD-10-CM | POA: Diagnosis not present

## 2021-06-02 DIAGNOSIS — R42 Dizziness and giddiness: Secondary | ICD-10-CM | POA: Diagnosis not present

## 2021-06-02 DIAGNOSIS — R079 Chest pain, unspecified: Secondary | ICD-10-CM | POA: Diagnosis not present

## 2021-06-15 DIAGNOSIS — H43392 Other vitreous opacities, left eye: Secondary | ICD-10-CM | POA: Diagnosis not present

## 2021-06-15 DIAGNOSIS — E113313 Type 2 diabetes mellitus with moderate nonproliferative diabetic retinopathy with macular edema, bilateral: Secondary | ICD-10-CM | POA: Diagnosis not present

## 2021-06-15 DIAGNOSIS — H2513 Age-related nuclear cataract, bilateral: Secondary | ICD-10-CM | POA: Diagnosis not present

## 2021-06-15 DIAGNOSIS — E1136 Type 2 diabetes mellitus with diabetic cataract: Secondary | ICD-10-CM | POA: Diagnosis not present

## 2021-06-21 ENCOUNTER — Encounter: Payer: Self-pay | Admitting: Family Medicine

## 2021-06-21 ENCOUNTER — Ambulatory Visit (INDEPENDENT_AMBULATORY_CARE_PROVIDER_SITE_OTHER): Payer: BC Managed Care – PPO | Admitting: Family Medicine

## 2021-06-21 VITALS — BP 108/69 | HR 83 | Temp 98.0°F | Ht 69.5 in | Wt 181.0 lb

## 2021-06-21 DIAGNOSIS — E559 Vitamin D deficiency, unspecified: Secondary | ICD-10-CM

## 2021-06-21 DIAGNOSIS — E1169 Type 2 diabetes mellitus with other specified complication: Secondary | ICD-10-CM | POA: Diagnosis not present

## 2021-06-21 DIAGNOSIS — F411 Generalized anxiety disorder: Secondary | ICD-10-CM | POA: Diagnosis not present

## 2021-06-21 DIAGNOSIS — G40209 Localization-related (focal) (partial) symptomatic epilepsy and epileptic syndromes with complex partial seizures, not intractable, without status epilepticus: Secondary | ICD-10-CM

## 2021-06-21 DIAGNOSIS — G40219 Localization-related (focal) (partial) symptomatic epilepsy and epileptic syndromes with complex partial seizures, intractable, without status epilepticus: Secondary | ICD-10-CM | POA: Insufficient documentation

## 2021-06-21 DIAGNOSIS — R634 Abnormal weight loss: Secondary | ICD-10-CM

## 2021-06-21 DIAGNOSIS — E1159 Type 2 diabetes mellitus with other circulatory complications: Secondary | ICD-10-CM | POA: Diagnosis not present

## 2021-06-21 DIAGNOSIS — Z1212 Encounter for screening for malignant neoplasm of rectum: Secondary | ICD-10-CM

## 2021-06-21 DIAGNOSIS — K219 Gastro-esophageal reflux disease without esophagitis: Secondary | ICD-10-CM | POA: Diagnosis not present

## 2021-06-21 DIAGNOSIS — Z1211 Encounter for screening for malignant neoplasm of colon: Secondary | ICD-10-CM

## 2021-06-21 DIAGNOSIS — Z125 Encounter for screening for malignant neoplasm of prostate: Secondary | ICD-10-CM

## 2021-06-21 DIAGNOSIS — Z79899 Other long term (current) drug therapy: Secondary | ICD-10-CM

## 2021-06-21 DIAGNOSIS — E538 Deficiency of other specified B group vitamins: Secondary | ICD-10-CM

## 2021-06-21 DIAGNOSIS — I152 Hypertension secondary to endocrine disorders: Secondary | ICD-10-CM

## 2021-06-21 LAB — URINALYSIS, ROUTINE W REFLEX MICROSCOPIC
Bilirubin, UA: NEGATIVE
Glucose, UA: NEGATIVE
Ketones, UA: NEGATIVE
Leukocytes,UA: NEGATIVE
Nitrite, UA: NEGATIVE
Protein,UA: NEGATIVE
RBC, UA: NEGATIVE
Specific Gravity, UA: 1.025 (ref 1.005–1.030)
Urobilinogen, Ur: 0.2 mg/dL (ref 0.2–1.0)
pH, UA: 5 (ref 5.0–7.5)

## 2021-06-21 LAB — BAYER DCA HB A1C WAIVED: HB A1C (BAYER DCA - WAIVED): 6.3 % — ABNORMAL HIGH (ref 4.8–5.6)

## 2021-06-21 MED ORDER — LISINOPRIL 5 MG PO TABS: 5.0000 mg | ORAL_TABLET | Freq: Every day | ORAL | 3 refills | Status: DC

## 2021-06-21 MED ORDER — OMEPRAZOLE 20 MG PO CPDR: 20.0000 mg | DELAYED_RELEASE_CAPSULE | Freq: Two times a day (BID) | ORAL | 3 refills | Status: DC

## 2021-06-21 MED ORDER — OMEPRAZOLE 20 MG PO CPDR: 20.0000 mg | DELAYED_RELEASE_CAPSULE | Freq: Every day | ORAL | 3 refills | Status: DC

## 2021-06-21 NOTE — Patient Instructions (Signed)

## 2021-06-21 NOTE — Progress Notes (Signed)
Subjective:  Patient ID: Devin Lucero, male    DOB: 15-Jun-1965, 56 y.o.   MRN: [id number]  Patient Care Team: Baruch Gouty, FNP as PCP - General (Family Medicine)   Chief Complaint:  New Patient (Initial Visit) (Establish care/)   HPI: [name] is a 56 y.o. male presenting on 06/21/2021 for New Patient (Initial Visit) (Establish care/)   Pt presents today to establish care with new PCP. He has been followed by the [facility] system. States he last saw his PCP over 5 months ago. He is on several medications and is vague with history, unsure of reason for some of his medications. Prior medical records have been requested. EHR database reviewed in detail.  He reports a history of diabetes, hypertension, hyperlipidemia, partial complex seizures, diabetic neuropathy, migraine headaches, Vit D deficiency, Vit B12 deficiency, GAD, depression, and GERD.  He is currently on ozempic monotherapy for his diabetes and reports great control. He is not on ASA therapy but is on ACEi and statin. Plavix noted on medication list, pt unsure why he is taking this. Reports seizures and migraines are well controlled with current regimen.  His biggest concern today is ongoing low blood pressure readings with intermittent dizziness. He was seen in the ED for this recently and was given 1L IV fluids. CT head and CXR were unremarkable. Labs unremarkable other than glucose of 140. States he has stopped his lisinopril altogether due to low BP readings. He would like to decrease the amount of medications he is taking on a daily basis as he feels this is contributing to his low blood pressure readings and dizziness.  He does report unintentional weight loss over the last several months. No other associated symptoms.      Relevant past medical, surgical, family, and social history reviewed and updated as indicated.  Allergies and medications reviewed and updated. Data reviewed: Chart in Epic.   Past Medical History:   Diagnosis Date   Allergy    Anxiety    Diabetes mellitus without complication (HCC)    GERD (gastroesophageal reflux disease)    Hyperlipemia    Migraines     History reviewed. No pertinent surgical history.  Social History   Socioeconomic History   Marital status: Married    Spouse name: Not on file   Number of children: Not on file   Years of education: Not on file   Highest education level: Not on file  Occupational History   Not on file  Tobacco Use   Smoking status: Never   Smokeless tobacco: Not on file  Substance and Sexual Activity   Alcohol use: No   Drug use: Not Currently   Sexual activity: Yes  Other Topics Concern   Not on file  Social History Narrative   Not on file   Social Determinants of Health   Financial Resource Strain: Not on file  Food Insecurity: Not on file  Transportation Needs: Not on file  Physical Activity: Not on file  Stress: Not on file  Social Connections: Not on file  Intimate Partner Violence: Not on file    Outpatient Encounter Medications as of 06/21/2021  Medication Sig   alprostadil (EDEX) 20 MCG injection 20 mcg by Intracavitary route as needed. use no more than 3 times per week   Cholecalciferol 25 MCG (1000 UT) tablet Take by mouth.   clopidogrel (PLAVIX) 75 MG tablet clopidogrel 75 mg tablet   cyanocobalamin (,VITAMIN B-12,) 1000 MCG/ML injection Inject into the  muscle.   EPINEPHrine 0.3 mg/0.3 mL IJ SOAJ injection Inject into the muscle.   gabapentin (NEURONTIN) 300 MG capsule Take 300 mg by mouth 3 (three) times daily.   glucose blood (ONE TOUCH ULTRA TEST) test strip Use to check blood sugar 2 times per day   levETIRAcetam (KEPPRA) 750 MG tablet Take 750 mg by mouth in the morning, at noon, and at bedtime.   lisinopril (ZESTRIL) 5 MG tablet Take 1 tablet (5 mg total) by mouth daily.   lurasidone (LATUDA) 40 MG TABS tablet Take 40 mg by mouth daily with breakfast.   meclizine (ANTIVERT) 25 MG tablet Take 25 mg by  mouth 3 (three) times daily as needed for dizziness.   OXcarbazepine (TRILEPTAL) 150 MG tablet Take 150 mg by mouth in the morning, at noon, and at bedtime.   prazosin (MINIPRESS) 2 MG capsule Take 4 mg by mouth at bedtime.   promethazine (PHENERGAN) 25 MG tablet promethazine 25 mg tablet   QUEtiapine (SEROQUEL) 25 MG tablet Take 25 mg by mouth at bedtime.   rizatriptan (MAXALT) 10 MG tablet Take 10 mg by mouth 2 (two) times daily as needed. May repeat in 2 hours if needed   rosuvastatin (CRESTOR) 40 MG tablet rosuvastatin 40 mg tablet   Semaglutide, 1 MG/DOSE, (OZEMPIC, 1 MG/DOSE,) 4 MG/3ML SOPN Inject into the skin.   sertraline (ZOLOFT) 100 MG tablet Take 150 mg by mouth daily.   topiramate (TOPAMAX) 25 MG tablet Take 50 mg by mouth in the morning, at noon, and at bedtime.   [DISCONTINUED] alprostadil (EDEX) 20 MCG injection Edex 20 mcg intracavernosal kit   [DISCONTINUED] citalopram (CELEXA) 20 MG tablet Take 20 mg by mouth daily.   [DISCONTINUED] insulin lispro (HUMALOG KWIKPEN) 100 UNIT/ML KiwkPen Inject 0.2 mLs (20 Units total) into the skin daily with breakfast.   [DISCONTINUED] Insulin NPH, Human,, Isophane, (HUMULIN N) 100 UNIT/ML Kiwkpen Inject 140 Units into the skin every morning. And pen needles 4/day   [DISCONTINUED] lisinopril (ZESTRIL) 10 MG tablet Take 10 mg by mouth daily.   [DISCONTINUED] LYRICA 300 MG capsule Take 3 capsules by mouth daily.   [DISCONTINUED] omeprazole (PRILOSEC) 20 MG capsule Take 20 mg by mouth daily.   [DISCONTINUED] omeprazole (PRILOSEC) 20 MG capsule Take 1 capsule by mouth daily.   [DISCONTINUED] omeprazole (PRILOSEC) 20 MG capsule Take 1 capsule (20 mg total) by mouth daily.   [DISCONTINUED] omeprazole (PRILOSEC) 40 MG capsule Take 1 capsule by mouth at bedtime.   [DISCONTINUED] Oxcarbazepine (TRILEPTAL) 300 MG tablet Take by mouth.   omeprazole (PRILOSEC) 20 MG capsule Take 1 capsule (20 mg total) by mouth 2 (two) times daily before a meal.   No  facility-administered encounter medications on file as of 06/21/2021.    Allergies  Allergen Reactions   Doxycycline Itching, Other (See Comments) and Rash    flushing Other reaction(s): Other (See Comments) ITCHING flushing ITCHING ITCHING ITCHING    Penicillins Rash and Itching    ITCHING ITCHING    Tetanus Immune Globulin Rash   Tetanus Toxoid Other (See Comments) and Rash    UNLISTED    Tetanus Toxoids Rash and Other (See Comments)    UNLISTED     Review of Systems  Constitutional:  Positive for unexpected weight change. Negative for activity change, appetite change, chills, diaphoresis, fatigue and fever.  HENT: Negative.    Eyes: Negative.  Negative for photophobia and visual disturbance.  Respiratory:  Negative for cough, chest tightness and shortness of breath.  Cardiovascular:  Negative for chest pain, palpitations and leg swelling.  Gastrointestinal:  Negative for abdominal pain, blood in stool, constipation, diarrhea, nausea and vomiting.  Endocrine: Negative.  Negative for cold intolerance, heat intolerance, polydipsia, polyphagia and polyuria.  Genitourinary:  Negative for decreased urine volume, difficulty urinating, dysuria, frequency and urgency.  Musculoskeletal:  Negative for arthralgias and myalgias.  Skin: Negative.   Allergic/Immunologic: Negative.   Neurological:  Positive for dizziness. Negative for tremors, seizures, syncope, facial asymmetry, speech difficulty, weakness, light-headedness, numbness and headaches.  Hematological: Negative.  Negative for adenopathy. Does not bruise/bleed easily.  Psychiatric/Behavioral:  Negative for confusion, hallucinations, sleep disturbance and suicidal ideas.   All other systems reviewed and are negative.      Objective:  BP 108/69   Pulse 83   Temp 98 F (36.7 C)   Ht 5' 9.5" (1.765 m)   Wt 181 lb (82.1 kg)   SpO2 96%   BMI 26.35 kg/m    Wt Readings from Last 3 Encounters:  06/21/21 181 lb (82.1 kg)   05/21/15 225 lb 6.4 oz (102.2 kg)  03/12/15 229 lb (103.9 kg)    Physical Exam Vitals and nursing note reviewed.  Constitutional:      General: He is not in acute distress.    Appearance: Normal appearance. He is well-developed, well-groomed and normal weight. He is not ill-appearing, toxic-appearing or diaphoretic.  HENT:     Head: Normocephalic and atraumatic.     Jaw: There is normal jaw occlusion.     Right Ear: Hearing normal.     Left Ear: Hearing normal.     Nose: Nose normal.     Mouth/Throat:     Lips: Pink.     Mouth: Mucous membranes are moist.     Pharynx: Oropharynx is clear. Uvula midline.  Eyes:     General: Lids are normal.     Extraocular Movements: Extraocular movements intact.     Conjunctiva/sclera: Conjunctivae normal.     Pupils: Pupils are equal, round, and reactive to light.  Neck:     Thyroid: No thyroid mass, thyromegaly or thyroid tenderness.     Vascular: No carotid bruit or JVD.     Trachea: Trachea and phonation normal.  Cardiovascular:     Rate and Rhythm: Normal rate and regular rhythm.     Chest Wall: PMI is not displaced.     Pulses: Normal pulses.     Heart sounds: Normal heart sounds. No murmur heard.   No friction rub. No gallop.  Pulmonary:     Effort: Pulmonary effort is normal. No respiratory distress.     Breath sounds: Normal breath sounds. No wheezing.  Abdominal:     General: Bowel sounds are normal. There is no distension or abdominal bruit.     Palpations: Abdomen is soft. There is no hepatomegaly or splenomegaly.     Tenderness: There is no abdominal tenderness. There is no right CVA tenderness or left CVA tenderness.     Hernia: No hernia is present.  Musculoskeletal:        General: Normal range of motion.     Cervical back: Normal range of motion and neck supple.     Right lower leg: No edema.     Left lower leg: No edema.  Lymphadenopathy:     Cervical: No cervical adenopathy.  Skin:    General: Skin is warm and  dry.     Capillary Refill: Capillary refill takes less than 2 seconds.  Coloration: Skin is not cyanotic, jaundiced or pale.     Findings: No rash.  Neurological:     General: No focal deficit present.     Mental Status: He is alert and oriented to person, place, and time.     Sensory: Sensation is intact.     Motor: Motor function is intact.     Coordination: Coordination is intact.     Gait: Gait is intact.     Deep Tendon Reflexes: Reflexes are normal and symmetric.  Psychiatric:        Attention and Perception: Attention and perception normal.        Mood and Affect: Mood and affect normal.        Speech: Speech normal.        Behavior: Behavior normal. Behavior is cooperative.        Thought Content: Thought content normal.        Cognition and Memory: Cognition and memory normal.        Judgment: Judgment normal.    Results for orders placed or performed in visit on 06/21/21  Bayer DCA Hb A1c Waived  Result Value Ref Range   HB A1C (BAYER DCA - WAIVED) 6.3 (H) 4.8 - 5.6 %  Urinalysis, Routine w reflex microscopic  Result Value Ref Range   Specific Gravity, UA 1.025 1.005 - 1.030   pH, UA 5.0 5.0 - 7.5   Color, UA Yellow Yellow   Appearance Ur Clear Clear   Leukocytes,UA Negative Negative   Protein,UA Negative Negative/Trace   Glucose, UA Negative Negative   Ketones, UA Negative Negative   RBC, UA Negative Negative   Bilirubin, UA Negative Negative   Urobilinogen, Ur 0.2 0.2 - 1.0 mg/dL   Nitrite, UA Negative Negative       Pertinent labs & imaging results that were available during my care of the patient were reviewed by me and considered in my medical decision making.  Assessment & Plan:  [place] was seen today for new patient (initial visit).  Diagnoses and all orders for this visit:  Type 2 diabetes mellitus with other specified complication, without long-term current use of insulin [facility]) Prior medical records have been requested. Will obtain baseline  labs today. Continue Ozempic at current dosing.  -     CBC with Differential/Platelet -     CMP14+EGFR -     Thyroid Panel With TSH -     VITAMIN D 25 Hydroxy (Vit-D Deficiency, Fractures) -     PSA, total and free -     Bayer DCA Hb A1c Waived -     Microalbumin / creatinine urine ratio -     Urinalysis, Routine w reflex microscopic  GAD (generalized anxiety disorder) Followed by [facility]. Well controlled. Continue current regimen.  -     Thyroid Panel With TSH  Gastroesophageal reflux disease without esophagitis No red flags concerning for esophagitis. Will continue Prilosec as prescribed.  -     omeprazole (PRILOSEC) 20 MG capsule; Take 1 capsule (20 mg total) by mouth 2 (two) times daily before a meal. -     CBC with Differential/Platelet  Hypertension associated with type 2 diabetes mellitus ([place]) Well controlled in office today. Will restart ACEi at low dose for renal protection due to DM. Labs pending. Diet and exercise encouraged.  -     CBC with Differential/Platelet -     CMP14+EGFR -     Thyroid Panel With TSH -     Microalbumin /  creatinine urine ratio -     Urinalysis, Routine w reflex microscopic -     lisinopril (ZESTRIL) 5 MG tablet; Take 1 tablet (5 mg total) by mouth daily.  Vitamin D deficiency Labs pending. Continue repletion therapy. If indicated, will change repletion dosage. Eat foods rich in Vit D including milk, orange juice, yogurt with vitamin D added, salmon or mackerel, canned tuna fish, cereals with vitamin D added, and cod liver oil. Get out in the sun but make sure to wear at least SPF 30 sunscreen.  -     VITAMIN D 25 Hydroxy (Vit-D Deficiency, Fractures)  Vitamin B12 deficiency Currently on repletion therapy once monthly. Will check levels today.  -     Vitamin B12  Partial symptomatic epilepsy with complex partial seizures, not intractable, without status epilepticus ([place]) Followed by neurology, will check Keppra levels today.  -     Levetiracetam  level  Polypharmacy Discussed tapering off of some medications once prior medical records have been received and reviewed.   Weight loss, unintentional Labs pending. Will check PSA and send for colonoscopy as he has not had one in the last 10 years.  -     CBC with Differential/Platelet -     CMP14+EGFR -     Thyroid Panel With TSH -     VITAMIN D 25 Hydroxy (Vit-D Deficiency, Fractures) -     PSA, total and free  Screening for prostate cancer -     PSA, total and free  Screening for colorectal cancer -     Ambulatory referral to Gastroenterology     Continue all other maintenance medications.  Follow up plan: Return in about 1 month (around 07/21/2021), or if symptoms worsen or fail to improve.   Continue healthy lifestyle choices, including diet (rich in fruits, vegetables, and lean proteins, and low in salt and simple carbohydrates) and exercise (at least 30 minutes of moderate physical activity daily).  Educational handout given for DM  The above assessment and management plan was discussed with the patient. The patient verbalized understanding of and has agreed to the management plan. Patient is aware to call the clinic if they develop any new symptoms or if symptoms persist or worsen. Patient is aware when to return to the clinic for a follow-up visit. Patient educated on when it is appropriate to go to the emergency department.   Monia Pouch, FNP-C [facility] [phone]

## 2021-06-22 LAB — CMP14+EGFR
ALT: 9 IU/L (ref 0–44)
AST: 16 IU/L (ref 0–40)
Albumin/Globulin Ratio: 2.2 (ref 1.2–2.2)
Albumin: 4.6 g/dL (ref 3.8–4.9)
Alkaline Phosphatase: 102 IU/L (ref 44–121)
BUN/Creatinine Ratio: 13 (ref 9–20)
BUN: 16 mg/dL (ref 6–24)
Bilirubin Total: 0.3 mg/dL (ref 0.0–1.2)
CO2: 19 mmol/L — ABNORMAL LOW (ref 20–29)
Calcium: 9.4 mg/dL (ref 8.7–10.2)
Chloride: 109 mmol/L — ABNORMAL HIGH (ref 96–106)
Creatinine, Ser: 1.24 mg/dL (ref 0.76–1.27)
Globulin, Total: 2.1 g/dL (ref 1.5–4.5)
Glucose: 161 mg/dL — ABNORMAL HIGH (ref 70–99)
Potassium: 4.4 mmol/L (ref 3.5–5.2)
Sodium: 141 mmol/L (ref 134–144)
Total Protein: 6.7 g/dL (ref 6.0–8.5)
eGFR: 68 mL/min/{1.73_m2} (ref >59)

## 2021-06-22 LAB — CBC WITH DIFFERENTIAL/PLATELET
Basophils Absolute: 0 10*3/uL (ref 0.0–0.2)
Basos: 1 %
EOS (ABSOLUTE): 0.1 10*3/uL (ref 0.0–0.4)
Eos: 1 %
Hematocrit: 37.5 % (ref 37.5–51.0)
Hemoglobin: 12.3 g/dL — ABNORMAL LOW (ref 13.0–17.7)
Immature Grans (Abs): 0 10*3/uL (ref 0.0–0.1)
Immature Granulocytes: 0 %
Lymphocytes Absolute: 1.7 10*3/uL (ref 0.7–3.1)
Lymphs: 21 %
MCH: 29 pg (ref 26.6–33.0)
MCHC: 32.8 g/dL (ref 31.5–35.7)
MCV: 88 fL (ref 79–97)
Monocytes Absolute: 0.4 10*3/uL (ref 0.1–0.9)
Monocytes: 5 %
Neutrophils Absolute: 5.6 10*3/uL (ref 1.4–7.0)
Neutrophils: 72 %
Platelets: 253 10*3/uL (ref 150–450)
RBC: 4.24 x10E6/uL (ref 4.14–5.80)
RDW: 12.6 % (ref 11.6–15.4)
WBC: 7.9 10*3/uL (ref 3.4–10.8)

## 2021-06-22 LAB — PSA, TOTAL AND FREE
PSA, Free Pct: 65 %
PSA, Free: 0.13 ng/mL
Prostate Specific Ag, Serum: 0.2 ng/mL (ref 0.0–4.0)

## 2021-06-22 LAB — THYROID PANEL WITH TSH
Free Thyroxine Index: 2.2 (ref 1.2–4.9)
T3 Uptake Ratio: 30 % (ref 24–39)
T4, Total: 7.2 ug/dL (ref 4.5–12.0)
TSH: 0.844 u[IU]/mL (ref 0.450–4.500)

## 2021-06-22 LAB — LEVETIRACETAM LEVEL: Levetiracetam Lvl: 6.8 ug/mL — ABNORMAL LOW (ref 10.0–40.0)

## 2021-06-22 LAB — MICROALBUMIN / CREATININE URINE RATIO
Creatinine, Urine: 157.5 mg/dL
Microalb/Creat Ratio: 10 mg/g creat (ref 0–29)
Microalbumin, Urine: 15.8 ug/mL

## 2021-06-22 LAB — VITAMIN B12: Vitamin B-12: 274 pg/mL (ref 232–1245)

## 2021-06-22 LAB — VITAMIN D 25 HYDROXY (VIT D DEFICIENCY, FRACTURES): Vit D, 25-Hydroxy: 33.8 ng/mL (ref 30.0–100.0)

## 2021-06-23 ENCOUNTER — Encounter: Payer: Self-pay | Admitting: *Deleted

## 2021-06-30 ENCOUNTER — Encounter: Payer: Self-pay | Admitting: Family Medicine

## 2021-06-30 DIAGNOSIS — F4310 Post-traumatic stress disorder, unspecified: Secondary | ICD-10-CM | POA: Insufficient documentation

## 2021-07-06 DIAGNOSIS — H4389 Other disorders of vitreous body: Secondary | ICD-10-CM | POA: Diagnosis not present

## 2021-07-06 DIAGNOSIS — H04123 Dry eye syndrome of bilateral lacrimal glands: Secondary | ICD-10-CM | POA: Diagnosis not present

## 2021-07-06 DIAGNOSIS — E113393 Type 2 diabetes mellitus with moderate nonproliferative diabetic retinopathy without macular edema, bilateral: Secondary | ICD-10-CM | POA: Diagnosis not present

## 2021-07-06 DIAGNOSIS — E1136 Type 2 diabetes mellitus with diabetic cataract: Secondary | ICD-10-CM | POA: Diagnosis not present

## 2021-07-08 DIAGNOSIS — G40209 Localization-related (focal) (partial) symptomatic epilepsy and epileptic syndromes with complex partial seizures, not intractable, without status epilepticus: Secondary | ICD-10-CM | POA: Diagnosis not present

## 2021-07-08 DIAGNOSIS — G43909 Migraine, unspecified, not intractable, without status migrainosus: Secondary | ICD-10-CM | POA: Diagnosis not present

## 2021-07-08 DIAGNOSIS — E1140 Type 2 diabetes mellitus with diabetic neuropathy, unspecified: Secondary | ICD-10-CM | POA: Diagnosis not present

## 2021-07-12 ENCOUNTER — Encounter: Payer: Self-pay | Admitting: *Deleted

## 2021-07-13 NOTE — Progress Notes (Signed)
Patient reported constipation. Recommend OV to discuss further to ensure adequate prep. Also, unclear why he is on Plavix. Will discuss at time of OV.   Please arrange VV on Friday with me if patient has the capability.

## 2021-07-14 NOTE — Progress Notes (Signed)
Please schedule OV per [facility] thanks

## 2021-07-21 ENCOUNTER — Encounter: Payer: Self-pay | Admitting: Family Medicine

## 2021-07-21 ENCOUNTER — Ambulatory Visit (INDEPENDENT_AMBULATORY_CARE_PROVIDER_SITE_OTHER): Payer: BC Managed Care – PPO | Admitting: Family Medicine

## 2021-07-21 VITALS — BP 118/73 | HR 80 | Temp 98.8°F | Ht 69.5 in | Wt 174.2 lb

## 2021-07-21 DIAGNOSIS — M778 Other enthesopathies, not elsewhere classified: Secondary | ICD-10-CM

## 2021-07-21 MED ORDER — PREDNISONE 20 MG PO TABS: ORAL_TABLET | ORAL | 0 refills | Status: DC

## 2021-07-21 NOTE — Progress Notes (Signed)
Subjective:  Patient ID: Devin Lucero, male    DOB: Aug 20, 1965, 56 y.o.   MRN: [id number]  Patient Care Team: Baruch Gouty, FNP as PCP - General (Family Medicine)   Chief Complaint:  Hand Pain   HPI: Devin Lucero is a 56 y.o. male presenting on 07/21/2021 for Hand Pain   Pt reports ongoing left index finger pain and stiffness. 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 States his finger has been hurting for several weeks and gets stiff at times. No injury, just repetitive use.   Hand Pain  The incident occurred more than 1 week ago. The injury mechanism was repetitive motion. The pain is present in the left fingers. The quality of the pain is described as aching and shooting. The pain does not radiate. The pain is mild. The pain has been Fluctuating since the incident. Pertinent negatives include no chest pain, muscle weakness, numbness or tingling. The symptoms are aggravated by movement, lifting and palpation. He has tried acetaminophen for the symptoms. The treatment provided no relief.     Relevant past medical, surgical, family, and social history reviewed and updated as indicated.  Allergies and medications reviewed and updated. Data reviewed: Chart in Epic.   Past Medical History:  Diagnosis Date   Allergy    Anxiety    Diabetes mellitus without complication (HCC)    GERD (gastroesophageal reflux disease)    Hyperlipemia    Migraines     History reviewed. No pertinent surgical history.  Social History   Socioeconomic History   Marital status: Married    Spouse name: Not on file   Number of children: Not on file   Years of education: Not on file   Highest education level: Not on file  Occupational History   Not on file  Tobacco Use   Smoking status: Never   Smokeless tobacco: Not on file  Substance and Sexual Activity   Alcohol use: No   Drug use: Not Currently   Sexual activity: Yes  Other Topics Concern   Not on file  Social  History Narrative   Not on file   Social Determinants of Health   Financial Resource Strain: Not on file  Food Insecurity: Not on file  Transportation Needs: Not on file  Physical Activity: Not on file  Stress: Not on file  Social Connections: Not on file  Intimate Partner Violence: Not on file    Outpatient Encounter Medications as of 07/21/2021  Medication Sig   alprostadil (EDEX) 20 MCG injection 20 mcg by Intracavitary route as needed. use no more than 3 times per week   Cholecalciferol 25 MCG (1000 UT) tablet Take by mouth.   clopidogrel (PLAVIX) 75 MG tablet clopidogrel 75 mg tablet   cyanocobalamin (,VITAMIN B-12,) 1000 MCG/ML injection Inject into the muscle.   EPINEPHrine 0.3 mg/0.3 mL IJ SOAJ injection Inject into the muscle.   gabapentin (NEURONTIN) 300 MG capsule Take 300 mg by mouth 3 (three) times daily.   glucose blood (ONE TOUCH ULTRA TEST) test strip Use to check blood sugar 2 times per day   levETIRAcetam (KEPPRA) 750 MG tablet Take 750 mg by mouth in the morning, at noon, and at bedtime.   lisinopril (ZESTRIL) 5 MG tablet Take 1 tablet (5 mg total) by mouth daily.   lurasidone (LATUDA) 40 MG TABS tablet Take 40 mg by mouth daily with breakfast.   meclizine (ANTIVERT) 25 MG tablet Take 25 mg by  mouth 3 (three) times daily as needed for dizziness.   omeprazole (PRILOSEC) 20 MG capsule Take 1 capsule (20 mg total) by mouth 2 (two) times daily before a meal.   OXcarbazepine (TRILEPTAL) 150 MG tablet Take 150 mg by mouth in the morning, at noon, and at bedtime.   prazosin (MINIPRESS) 2 MG capsule Take 4 mg by mouth at bedtime.   predniSONE (DELTASONE) 20 MG tablet 2 po at sametime daily for 5 days- start tomorrow   promethazine (PHENERGAN) 25 MG tablet promethazine 25 mg tablet   QUEtiapine (SEROQUEL) 25 MG tablet Take 25 mg by mouth at bedtime.   rizatriptan (MAXALT) 10 MG tablet Take 10 mg by mouth 2 (two) times daily as needed. May repeat in 2 hours if needed    rosuvastatin (CRESTOR) 40 MG tablet rosuvastatin 40 mg tablet   Semaglutide, 1 MG/DOSE, (OZEMPIC, 1 MG/DOSE,) 4 MG/3ML SOPN Inject into the skin.   sertraline (ZOLOFT) 100 MG tablet Take 150 mg by mouth daily.   topiramate (TOPAMAX) 25 MG tablet Take 50 mg by mouth in the morning, at noon, and at bedtime.   No facility-administered encounter medications on file as of 07/21/2021.    Allergies  Allergen Reactions   Doxycycline Itching, Other (See Comments) and Rash    flushing Other reaction(s): Other (See Comments) ITCHING flushing ITCHING ITCHING ITCHING    Penicillins Rash and Itching    ITCHING ITCHING    Tetanus Immune Globulin Rash   Tetanus Toxoid Other (See Comments) and Rash    UNLISTED    Tetanus Toxoids Rash and Other (See Comments)    UNLISTED     Review of Systems  Constitutional:  Negative for activity change, appetite change, chills, fatigue and fever.  HENT: Negative.    Eyes: Negative.   Respiratory:  Negative for cough, chest tightness and shortness of breath.   Cardiovascular:  Negative for chest pain, palpitations and leg swelling.  Gastrointestinal:  Negative for abdominal pain, blood in stool, constipation, diarrhea, nausea and vomiting.  Endocrine: Negative.   Genitourinary:  Negative for dysuria, frequency and urgency.  Musculoskeletal:  Positive for arthralgias, joint swelling and myalgias.  Skin: Negative.   Allergic/Immunologic: Negative.   Neurological:  Negative for dizziness, tingling, numbness and headaches.  Hematological: Negative.   Psychiatric/Behavioral:  Negative for confusion, hallucinations, sleep disturbance and suicidal ideas.   All other systems reviewed and are negative.       Objective:  BP 118/73   Pulse 80   Temp 98.8 F (37.1 C)   Ht 5' 9.5" (1.765 m)   Wt 174 lb 3.2 oz (79 kg)   SpO2 96%   BMI 25.36 kg/m    Wt Readings from Last 3 Encounters:  07/21/21 174 lb 3.2 oz (79 kg)  06/21/21 181 lb (82.1 kg)   05/21/15 225 lb 6.4 oz (102.2 kg)    Physical Exam Vitals and nursing note reviewed.  Constitutional:      General: He is not in acute distress.    Appearance: Normal appearance. He is not ill-appearing, toxic-appearing or diaphoretic.  HENT:     Head: Normocephalic and atraumatic.     Mouth/Throat:     Mouth: Mucous membranes are moist.  Eyes:     Pupils: Pupils are equal, round, and reactive to light.  Cardiovascular:     Rate and Rhythm: Normal rate and regular rhythm.     Heart sounds: Normal heart sounds.  Pulmonary:     Effort: Pulmonary effort is  normal.     Breath sounds: Normal breath sounds.  Musculoskeletal:     Right wrist: Normal.     Left wrist: Normal.     Right hand: Normal.     Left hand: Swelling and tenderness (left index finger 1st AV pulley) present. No deformity, lacerations or bony tenderness. Decreased range of motion (left index finger - flexion). Normal strength. Normal sensation. There is no disruption of two-point discrimination. Normal capillary refill. Normal pulse.     Right lower leg: No edema.     Left lower leg: No edema.  Skin:    General: Skin is warm and dry.     Capillary Refill: Capillary refill takes less than 2 seconds.  Neurological:     General: No focal deficit present.     Mental Status: He is alert and oriented to person, place, and time.  Psychiatric:        Mood and Affect: Mood normal.        Behavior: Behavior normal.        Thought Content: Thought content normal.        Judgment: Judgment normal.     Results for orders placed or performed in visit on 06/21/21  CBC with Differential/Platelet  Result Value Ref Range   WBC 7.9 3.4 - 10.8 x10E3/uL   RBC 4.24 4.14 - 5.80 x10E6/uL   Hemoglobin 12.3 (L) 13.0 - 17.7 g/dL   Hematocrit 37.5 37.5 - 51.0 %   MCV 88 79 - 97 fL   MCH 29.0 26.6 - 33.0 pg   MCHC 32.8 31.5 - 35.7 g/dL   RDW 12.6 11.6 - 15.4 %   Platelets 253 150 - 450 x10E3/uL   Neutrophils 72 Not Estab. %    Lymphs 21 Not Estab. %   Monocytes 5 Not Estab. %   Eos 1 Not Estab. %   Basos 1 Not Estab. %   Neutrophils Absolute 5.6 1.4 - 7.0 x10E3/uL   Lymphocytes Absolute 1.7 0.7 - 3.1 x10E3/uL   Monocytes Absolute 0.4 0.1 - 0.9 x10E3/uL   EOS (ABSOLUTE) 0.1 0.0 - 0.4 x10E3/uL   Basophils Absolute 0.0 0.0 - 0.2 x10E3/uL   Immature Granulocytes 0 Not Estab. %   Immature Grans (Abs) 0.0 0.0 - 0.1 x10E3/uL  CMP14+EGFR  Result Value Ref Range   Glucose 161 (H) 70 - 99 mg/dL   BUN 16 6 - 24 mg/dL   Creatinine, Ser 1.24 0.76 - 1.27 mg/dL   eGFR 68 >59 mL/min/1.73   BUN/Creatinine Ratio 13 9 - 20   Sodium 141 134 - 144 mmol/L   Potassium 4.4 3.5 - 5.2 mmol/L   Chloride 109 (H) 96 - 106 mmol/L   CO2 19 (L) 20 - 29 mmol/L   Calcium 9.4 8.7 - 10.2 mg/dL   Total Protein 6.7 6.0 - 8.5 g/dL   Albumin 4.6 3.8 - 4.9 g/dL   Globulin, Total 2.1 1.5 - 4.5 g/dL   Albumin/Globulin Ratio 2.2 1.2 - 2.2   Bilirubin Total 0.3 0.0 - 1.2 mg/dL   Alkaline Phosphatase 102 44 - 121 IU/L   AST 16 0 - 40 IU/L   ALT 9 0 - 44 IU/L  Thyroid Panel With TSH  Result Value Ref Range   TSH 0.844 0.450 - 4.500 uIU/mL   T4, Total 7.2 4.5 - 12.0 ug/dL   T3 Uptake Ratio 30 24 - 39 %   Free Thyroxine Index 2.2 1.2 - 4.9  VITAMIN D 25 Hydroxy (Vit-D Deficiency,  Fractures)  Result Value Ref Range   Vit D, 25-Hydroxy 33.8 30.0 - 100.0 ng/mL  PSA, total and free  Result Value Ref Range   Prostate Specific Ag, Serum 0.2 0.0 - 4.0 ng/mL   PSA, Free 0.13 N/A ng/mL   PSA, Free Pct 65.0 %  Bayer DCA Hb A1c Waived  Result Value Ref Range   HB A1C (BAYER DCA - WAIVED) 6.3 (H) 4.8 - 5.6 %  Microalbumin / creatinine urine ratio  Result Value Ref Range   Creatinine, Urine 157.5 Not Estab. mg/dL   Microalbumin, Urine 15.8 Not Estab. ug/mL   Microalb/Creat Ratio 10 0 - 29 mg/g creat  Urinalysis, Routine w reflex microscopic  Result Value Ref Range   Specific Gravity, UA 1.025 1.005 - 1.030   pH, UA 5.0 5.0 - 7.5   Color, UA  Yellow Yellow   Appearance Ur Clear Clear   Leukocytes,UA Negative Negative   Protein,UA Negative Negative/Trace   Glucose, UA Negative Negative   Ketones, UA Negative Negative   RBC, UA Negative Negative   Bilirubin, UA Negative Negative   Urobilinogen, Ur 0.2 0.2 - 1.0 mg/dL   Nitrite, UA Negative Negative  Vitamin B12  Result Value Ref Range   Vitamin B-12 274 232 - 1,245 pg/mL  Levetiracetam level  Result Value Ref Range   Levetiracetam Lvl 6.8 (L) 10.0 - 40.0 ug/mL       Pertinent labs & imaging results that were available during my care of the patient were reviewed by me and considered in my medical decision making.  Assessment & Plan:  Trip was seen today for hand pain.  Diagnoses and all orders for this visit:  Tendonitis of finger No locking or catching of finger. Will burst with steroids to see if beneficial. Symptomatic care discussed in detail. Pt aware to report any new, worsening, or persistent symptoms.  -     predniSONE (DELTASONE) 20 MG tablet; 2 po at sametime daily for 5 days- start tomorrow     Continue all other maintenance medications.  Follow up plan: Return in about 3 months (around 10/21/2021), or if symptoms worsen or fail to improve, for DM.   The above assessment and management plan was discussed with the patient. The patient verbalized understanding of and has agreed to the management plan. Patient is aware to call the clinic if they develop any new symptoms or if symptoms persist or worsen. Patient is aware when to return to the clinic for a follow-up visit. Patient educated on when it is appropriate to go to the emergency department.   Monia Pouch, FNP-C [facility] [phone]

## 2021-07-21 NOTE — Progress Notes (Signed)
Referring Provider: Baruch Gouty, FNP  Primary Care Physician:  Baruch Gouty, FNP  Primary GI:  Dr. Gala Romney  Patient Location: Home   Provider Location: [facility] office   Reason for Visit: Colon cancer screening   Persons present on the virtual encounter, with roles: Aliene Altes, PA-C (Provider), Devin Lucero (patient)   Total time (minutes) spent on medical discussion: 18 minutes  Virtual Visit via video Note Due to COVID-19, visit is conducted virtually and was requested by patient.   I connected with Devin Lucero on 07/22/21 at  9:30 AM EDT by video and verified that I am speaking with the correct person using two identifiers.   I discussed the limitations, risks, security and privacy concerns of performing an evaluation and management service by video and the availability of in person appointments. I also discussed with the patient that there may be a patient responsible charge related to this service. The patient expressed understanding and agreed to proceed.  Chief Complaint  Patient presents with   Colonoscopy    Screening      History of Present Illness: Devin Lucero is a 56 year old male in today at the request of Rakes, Connye Burkitt, FNP for colon cancer screening.  He was initially triaged, reported constipation.  Also noted he was on Plavix for unclear reasons.  Recommended office visit to discuss further.  Today: Last colonoscopy many years ago, too long to remember when it was. Doesn't think he had polyps. May have been at [facility].   Reports his bowels move every 4 to 5 days.  This has been his baseline for the last 8 or 9 months.  Previously, he had bowel movements every 2 to 3 days.  His stools are hard.  Does not take anything to help with bowel regularity.  Denies BRBPR or melena.  No family history of colon cancer or colon polyps.  He has been losing weight.  Reports he weighed 230 lbs in July 2022. Now 174 lbs.  Thinks Ozempic has something to do with it.   He had been on Ozempic, but his dose was increased, and he subsequently had loss of appetite.  This is improving somewhat, but states he still has his moments.  Denies nausea, vomiting. Takes omeprazole for reflux which keeps his symptoms well controlled. No dysphagia.   Not sure why he is on Plavix. Has been on for a long time. Had a mild heart attack about 4-5 years ago. No stents placed. Was hospitalized at [facility] for 3 days.  I reviewed Echocardiogram from 2020 which looked good overall.  He denies chest pain.  He reports he is never had a stroke or other vascular stents placed.  States the medications that he is on now are from many years ago and he has been discussing trying to get off of some of these medications with his PCP.  Past Medical History:  Diagnosis Date   Allergy    Anxiety    Depression    Diabetes mellitus without complication ([place])    GERD (gastroesophageal reflux disease)    Heart attack ([place])    Patient reports mild heart attack around 2018/2019. Stayed at [facility] for a few days. No intervention.   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 No polyps per patient.  Current Meds  Medication Sig   alprostadil (EDEX) 20 MCG injection 20 mcg by Intracavitary route as needed. use no more than 3 times per week   Cholecalciferol 25 MCG (1000 UT) tablet Take by mouth.   clopidogrel (PLAVIX) 75 MG tablet clopidogrel 75 mg tablet   cyanocobalamin (,VITAMIN B-12,) 1000 MCG/ML injection Inject into the muscle.   EPINEPHrine 0.3 mg/0.3 mL IJ SOAJ injection Inject into the muscle.   gabapentin (NEURONTIN) 300 MG capsule Take 300 mg by mouth 3 (three) times daily.   glucose blood (ONE TOUCH ULTRA TEST) test strip Use to check blood sugar 2 times per day   levETIRAcetam (KEPPRA) 750 MG tablet Take 750 mg by  mouth in the morning, at noon, and at bedtime.   lisinopril (ZESTRIL) 5 MG tablet Take 1 tablet (5 mg total) by mouth daily.   lurasidone (LATUDA) 40 MG TABS tablet Take 40 mg by mouth daily with breakfast.   meclizine (ANTIVERT) 25 MG tablet Take 25 mg by mouth 3 (three) times daily as needed for dizziness.   omeprazole (PRILOSEC) 20 MG capsule Take 1 capsule (20 mg total) by mouth 2 (two) times daily before a meal.   prazosin (MINIPRESS) 2 MG capsule Take 4 mg by mouth at bedtime.   QUEtiapine (SEROQUEL) 25 MG tablet Take 25 mg by mouth at bedtime.   rizatriptan (MAXALT) 10 MG tablet Take 10 mg by mouth 2 (two) times daily as needed. May repeat in 2 hours if needed   rosuvastatin (CRESTOR) 40 MG tablet rosuvastatin 40 mg tablet   Semaglutide, 1 MG/DOSE, (OZEMPIC, 1 MG/DOSE,) 4 MG/3ML SOPN Inject into the skin.   sertraline (ZOLOFT) 100 MG tablet Take 150 mg by mouth daily.   topiramate (TOPAMAX) 25 MG tablet Take 50 mg by mouth in the morning, at noon, and at bedtime.     Family History  Problem Relation Age of Onset   Heart disease Father    Hyperlipidemia Father    Hypertension Father    Diabetes Paternal Grandmother    Colon cancer Neg Hx     Social History   Socioeconomic History   Marital status: Married    Spouse name: Not on file   Number of children: Not on file   Years of education: Not on file   Highest education level: Not on file  Occupational History   Not on file  Tobacco Use   Smoking status: Never   Smokeless tobacco: Not on file  Substance and Sexual Activity   Alcohol use: No   Drug use: Not Currently   Sexual activity: Yes  Other Topics Concern   Not on file  Social History Narrative   Not on file   Social Determinants of Health   Financial Resource Strain: Not on file  Food Insecurity: Not on file  Transportation Needs: Not on file  Physical Activity: Not on file  Stress: Not on file  Social Connections: Not on file     Review of  Systems: Gen: Denies fever, chills, cold or flu like symptoms, pre-syncope, or syncope.  CV: Denies chest pain, palpitations. Resp: Denies dyspnea, cough.  GI: see HPI Derm: Denies rash. Psych: Depression/anxiety well controlled. Heme: See HPI  Observations/Objective: No distress. Alert and oriented. Pleasant. Well nourished. Normal mood and affect. Unable to perform complete physical exam due to video encounter.    Assessment:  56 year old male with history of anxiety, depression, type 2 diabetes, GERD, HLD, partial complex seizures, and ?  heart  attack in 2018/2019 per patient with no intervention required,  currently on Plavix for unknown reasons, presenting today to discuss scheduling colonoscopy, also reporting constipation and weight loss.  Colon cancer screening:  Patient reports last colonoscopy was many years ago, possibly at [place].  Does not think he had polyps.  No family history of colon cancer or colon polyps.  He has been dealing with constipation for the last 8 or 9 months discussed below as well as weight loss discussed below.  Denies rectal bleeding or melena.   Constipation:  Started 8 or 9 months ago.  Previously with bowel movements every 2 to 3 days, now every 4 to 5 days with hard stools, and not taking anything to help with this.  Denies BRBPR or melena.  He reports close to 60 pound weight loss over the last year which she attributes to Ozempic causing loss of appetite.   Etiology of constipation/change in bowel habits may be benign, but with weight loss, need to rule out underlying malignancy.  We will try him on over-the-counter medications first for his constipation.  Requested he let me know if any persistent constipation and we can try prescriptive agent.  Weight loss:  Patient reports close to 60 pound unintentional weight loss over the last year.  He attributes this to Ozempic which caused loss of appetite after the dose was increased.  He reports this is  improving somewhat, but he still has some trouble.  Denies nausea or vomiting.  Chronic reflux well controlled on omeprazole daily.  Discussed scheduling an EGD to ensure no upper GI pathology contributing to lack of appetite and weight loss, but patient prefers to hold off on this for now.  Antiplatelet long term use:  Patient is currently on Plavix.  He reports he has been on this for years and is not sure why.  Denies history of stroke or any vascular stent placement.  He does report history of "mild heart attack" in 2018 or 2019 for which he was hospitalized in [facility] for a few days with no intervention required. He is discussing his medications with his new PCP and hopes to come off Plavix soon.    Plan: Proceed with colonoscopy with propofol by Dr. Gala Romney in near future. The risks, benefits, and alternatives have been discussed with the patient in detail. The patient states understanding and desires to proceed. ASA 3 Patient will continue Plavix for colonoscopy unless discontinued by PCP. No diabetes medications day of procedure. We will give Linzess 145 mcg daily x4 days prior to colon prep due to history of constipation. Start Colace 100 mg daily.  May increase to 200 or 300 mg daily if needed. If Colace does not manage constipation adequately, advised to use MiraLAX 17 g daily in 8 ounces of water. He will let me know of any persistent constipation.  Discussed the importance of bowels moving well prior to colonoscopy to ensure adequate prep. Recommended 4-6 small meals daily and adding daily protein shakes to help maintain weight. We will attempt to obtain colonoscopy records. Follow-up after colonoscopy.     I discussed the assessment and treatment plan with the patient. The patient was provided an opportunity to ask questions and all were answered. The patient agreed with the plan and demonstrated an understanding of the instructions.   The patient was advised to call back  or seek an in-person evaluation if the symptoms worsen or if the condition fails to improve as anticipated.  I provided  18 minutes of video-face-to-face time during this encounter.  Aliene Altes, PA-C [facility]  07/22/2021

## 2021-07-22 ENCOUNTER — Encounter: Payer: Self-pay | Admitting: *Deleted

## 2021-07-22 ENCOUNTER — Telehealth: Payer: Self-pay | Admitting: Gastroenterology

## 2021-07-22 ENCOUNTER — Encounter: Payer: Self-pay | Admitting: Gastroenterology

## 2021-07-22 ENCOUNTER — Telehealth (INDEPENDENT_AMBULATORY_CARE_PROVIDER_SITE_OTHER): Payer: BC Managed Care – PPO | Admitting: Gastroenterology

## 2021-07-22 VITALS — Ht 69.0 in | Wt 174.0 lb

## 2021-07-22 DIAGNOSIS — K5900 Constipation, unspecified: Secondary | ICD-10-CM

## 2021-07-22 DIAGNOSIS — Z7902 Long term (current) use of antithrombotics/antiplatelets: Secondary | ICD-10-CM

## 2021-07-22 DIAGNOSIS — R634 Abnormal weight loss: Secondary | ICD-10-CM | POA: Insufficient documentation

## 2021-07-22 DIAGNOSIS — Z1211 Encounter for screening for malignant neoplasm of colon: Secondary | ICD-10-CM | POA: Diagnosis not present

## 2021-07-22 DIAGNOSIS — R194 Change in bowel habit: Secondary | ICD-10-CM

## 2021-07-22 MED ORDER — PEG 3350-KCL-NA BICARB-NACL 420 G PO SOLR: ORAL | 0 refills | Status: DC

## 2021-07-22 NOTE — Addendum Note (Signed)
Addended by: Cheron Every on: 07/22/2021 10:22 AM   Modules accepted: Orders

## 2021-07-22 NOTE — Telephone Encounter (Signed)
Mindy:  Please arrange colonoscopy with propofol with Dr. Gala Romney. ASA 3. No morning diabetes medications day of procedure. He will need Linzess 145 mcg daily x4 days prior to starting colon prep.  Manuela Schwartz:  Please request colonoscopy records from [facility].

## 2021-07-22 NOTE — Patient Instructions (Addendum)
We will arrange for you to have a colonoscopy in the near future with Dr. Gala Romney.  For constipation: Start Colace (docusate sodium) 100 mg daily.  You can increase this to 200 or 300 mg daily if needed. If Colace is not helping, try MiraLAX 1 capful (17 g) daily in 8 ounces of water. Please let me know if the above recommendations are not helping you have productive bowel movements daily to every other day. As we discussed, it is very important that your bowels are moving well prior to your colonoscopy to ensure that you have a good colon prep.  To help maintain your weight:  Try eating 4-6 small meals daily. Add protein shakes daily.  We will follow-up with you after your colonoscopy.  Do not hesitate to call if you have any questions or concerns prior to your next visit.  It was very nice meeting you today!!  Aliene Altes, PA-C [facility]

## 2021-07-22 NOTE — Telephone Encounter (Signed)
Called pt. Scheduled for 8/16 at 10am. He will come by office and pick up linzess sample with instructions/pre-op appt.

## 2021-07-27 ENCOUNTER — Telehealth: Payer: Self-pay | Admitting: Gastroenterology

## 2021-07-27 NOTE — Telephone Encounter (Signed)
Received and reviewed colonoscopy report from [facility].  Procedure dated 02/05/2015.  Indication was for screening colonoscopy.  He was found to have external and internal hemorrhoids, scattered diverticula in the sigmoid and descending colon, stool in the sigmoid and descending colon s/p lavage with fair visualization, otherwise normal exam.  Recommended repeat colonoscopy in 5-10 years for screening purposes.  No additional recommendations at this time.  We will proceed with colonoscopy as planned due to change in bowel habits and weight loss.

## 2021-08-09 DIAGNOSIS — Z7985 Long-term (current) use of injectable non-insulin antidiabetic drugs: Secondary | ICD-10-CM | POA: Diagnosis not present

## 2021-08-09 DIAGNOSIS — E113399 Type 2 diabetes mellitus with moderate nonproliferative diabetic retinopathy without macular edema, unspecified eye: Secondary | ICD-10-CM | POA: Diagnosis not present

## 2021-08-12 DIAGNOSIS — Z0189 Encounter for other specified special examinations: Secondary | ICD-10-CM | POA: Diagnosis not present

## 2021-08-18 ENCOUNTER — Ambulatory Visit: Payer: BC Managed Care – PPO | Admitting: Pharmacist

## 2021-08-25 ENCOUNTER — Ambulatory Visit (INDEPENDENT_AMBULATORY_CARE_PROVIDER_SITE_OTHER): Payer: BC Managed Care – PPO | Admitting: Pharmacist

## 2021-08-25 DIAGNOSIS — E1169 Type 2 diabetes mellitus with other specified complication: Secondary | ICD-10-CM | POA: Diagnosis not present

## 2021-08-26 NOTE — Patient Instructions (Signed)
Devin Lucero  08/26/2021     '@PREFPERIOPPHARMACY'$ @   Your procedure is scheduled on  08/31/2021.   Report to [name] at  0800 A.M.   Call this number if you have problems the morning of surgery:  [phone]   Remember:  Follow the diet and prep instructions given to you by the office.     Your last dose of ozempic should have been on 08/24/2021 or before.     DO NOT take any medications for diabetes the morning of your procedure.    Take these medicines the morning of surgery with A SIP OF WATER        neurontin, keppra, latuda, antivert(if needed), prilosec, maxalt(if needed), zoloft, topamax.    Do not wear jewelry, make-up or nail polish.  Do not wear lotions, powders, or perfumes, or deodorant.  Do not shave 48 hours prior to surgery.  Men may shave face and neck.  Do not bring valuables to the hospital.  [facility] is not responsible for any belongings or valuables.  Contacts, dentures or bridgework may not be worn into surgery.  Leave your suitcase in the car.  After surgery it may be brought to your room.  For patients admitted to the hospital, discharge time will be determined by your treatment team.  Patients discharged the day of surgery will not be allowed to drive home and must have someone with them for 24 hours.     Special instructions:   DO NOT smoke tobacco or vape for 24 hours before your procedure.  Please read over the following fact sheets that you were given. Anesthesia Post-op Instructions and Care and Recovery After Surgery      Colonoscopy, Adult, Care After The following information offers guidance on how to care for yourself after your procedure. Your health care provider may also give you more specific instructions. If you have problems or questions, contact your health care provider. What can I expect after the procedure? After the procedure, it is common to have: A small amount of blood in your stool for 24 hours after the  procedure. Some gas. Mild cramping or bloating of your abdomen. Follow these instructions at home: Eating and drinking  Drink enough fluid to keep your urine pale yellow. Follow instructions from your health care provider about eating or drinking restrictions. Resume your normal diet as told by your health care provider. Avoid heavy or fried foods that are hard to digest. Activity Rest as told by your health care provider. Avoid sitting for a long time without moving. Get up to take short walks every 1-2 hours. This is important to improve blood flow and breathing. Ask for help if you feel weak or unsteady. Return to your normal activities as told by your health care provider. Ask your health care provider what activities are safe for you. Managing cramping and bloating  Try walking around when you have cramps or feel bloated. If directed, apply heat to your abdomen as told by your health care provider. Use the heat source that your health care provider recommends, such as a moist heat pack or a heating pad. Place a towel between your skin and the heat source. Leave the heat on for 20-30 minutes. Remove the heat if your skin turns bright red. This is especially important if you are unable to feel pain, heat, or cold. You have a greater risk of getting burned. General instructions If you were given a sedative  during the procedure, it can affect you for several hours. Do not drive or operate machinery until your health care provider says that it is safe. For the first 24 hours after the procedure: Do not sign important documents. Do not drink alcohol. Do your regular daily activities at a slower pace than normal. Eat soft foods that are easy to digest. Take over-the-counter and prescription medicines only as told by your health care provider. Keep all follow-up visits. This is important. Contact a health care provider if: You have blood in your stool 2-3 days after the procedure. Get  help right away if: You have more than a small spotting of blood in your stool. You have large blood clots in your stool. You have swelling of your abdomen. You have nausea or vomiting. You have a fever. You have increasing pain in your abdomen that is not relieved with medicine. These symptoms may be an emergency. Get help right away. Call 911. Do not wait to see if the symptoms will go away. Do not drive yourself to the hospital. Summary After the procedure, it is common to have a small amount of blood in your stool. You may also have mild cramping and bloating of your abdomen. If you were given a sedative during the procedure, it can affect you for several hours. Do not drive or operate machinery until your health care provider says that it is safe. Get help right away if you have a lot of blood in your stool, nausea or vomiting, a fever, or increased pain in your abdomen. This information is not intended to replace advice given to you by your health care provider. Make sure you discuss any questions you have with your health care provider. Document Revised: 08/25/2020 Document Reviewed: 08/25/2020 Elsevier Patient Education  [place] After This sheet gives you information about how to care for yourself after your procedure. Your health care provider may also give you more specific instructions. If you have problems or questions, contact your health care provider. What can I expect after the procedure? After the procedure, it is common to have: Tiredness. Forgetfulness about what happened after the procedure. Impaired judgment for important decisions. Nausea or vomiting. Some difficulty with balance. Follow these instructions at home: For the time period you were told by your health care provider:     Rest as needed. Do not participate in activities where you could fall or become injured. Do not drive or use machinery. Do not drink  alcohol. Do not take sleeping pills or medicines that cause drowsiness. Do not make important decisions or sign legal documents. Do not take care of children on your own. Eating and drinking Follow the diet that is recommended by your health care provider. Drink enough fluid to keep your urine pale yellow. If you vomit: Drink water, juice, or soup when you can drink without vomiting. Make sure you have little or no nausea before eating solid foods. General instructions Have a responsible adult stay with you for the time you are told. It is important to have someone help care for you until you are awake and alert. Take over-the-counter and prescription medicines only as told by your health care provider. If you have sleep apnea, surgery and certain medicines can increase your risk for breathing problems. Follow instructions from your health care provider about wearing your sleep device: Anytime you are sleeping, including during daytime naps. While taking prescription pain medicines, sleeping medicines, or medicines that  make you drowsy. Avoid smoking. Keep all follow-up visits as told by your health care provider. This is important. Contact a health care provider if: You keep feeling nauseous or you keep vomiting. You feel light-headed. You are still sleepy or having trouble with balance after 24 hours. You develop a rash. You have a fever. You have redness or swelling around the IV site. Get help right away if: You have trouble breathing. You have new-onset confusion at home. Summary For several hours after your procedure, you may feel tired. You may also be forgetful and have poor judgment. Have a responsible adult stay with you for the time you are told. It is important to have someone help care for you until you are awake and alert. Rest as told. Do not drive or operate machinery. Do not drink alcohol or take sleeping pills. Get help right away if you have trouble breathing, or if  you suddenly become confused. This information is not intended to replace advice given to you by your health care provider. Make sure you discuss any questions you have with your health care provider. Document Revised: 12/07/2020 Document Reviewed: 12/05/2018 Elsevier Patient Education  [place].

## 2021-08-29 ENCOUNTER — Encounter (HOSPITAL_COMMUNITY)
Admission: RE | Admit: 2021-08-29 | Discharge: 2021-08-29 | Disposition: A | Discharge status: Home / Self Care | Payer: BC Managed Care – PPO | Source: Ambulatory Visit | Attending: Internal Medicine | Admitting: Internal Medicine

## 2021-08-29 ENCOUNTER — Telehealth: Payer: Self-pay | Admitting: *Deleted

## 2021-08-29 ENCOUNTER — Encounter (HOSPITAL_COMMUNITY): Payer: Self-pay

## 2021-08-29 ENCOUNTER — Other Ambulatory Visit: Payer: Self-pay

## 2021-08-29 VITALS — BP 107/64 | HR 87 | Temp 97.8°F | Resp 18 | Ht 69.0 in | Wt 173.9 lb

## 2021-08-29 DIAGNOSIS — I152 Hypertension secondary to endocrine disorders: Secondary | ICD-10-CM | POA: Diagnosis not present

## 2021-08-29 DIAGNOSIS — Z01810 Encounter for preprocedural cardiovascular examination: Secondary | ICD-10-CM | POA: Insufficient documentation

## 2021-08-29 DIAGNOSIS — E1159 Type 2 diabetes mellitus with other circulatory complications: Secondary | ICD-10-CM | POA: Diagnosis not present

## 2021-08-29 HISTORY — DX: Sleep apnea, unspecified: G47.30

## 2021-08-29 NOTE — Telephone Encounter (Signed)
Spoke with pt. He has been rescheduled to 9/22 at 8:15am. Confirmed with pt he is only on Ozempic and he takes this on Sundays. He is aware this will need to be held the Sunday prior to procedure.

## 2021-08-29 NOTE — Telephone Encounter (Signed)
Received call from endo. Patient procedure for Wednesday needs to be rescheduled. Per recent guidelines pt ozempic needs to be held 1 week prior. Dr. Wyatt Haste already spoke with pt.

## 2021-08-29 NOTE — Progress Notes (Signed)
Patient took last dose of Ozempic yesterday.  Dr Charna Elizabeth in to talk with patient.  Colonoscopy to be rescheduled so patient will be off Ozempic for 7 days proir to procedure.  Mindy at office will call patient with new date

## 2021-08-29 NOTE — Telephone Encounter (Signed)
Pt going to stop by office to pick up his linzess sample

## 2021-08-30 ENCOUNTER — Encounter: Payer: Self-pay | Admitting: *Deleted

## 2021-09-02 NOTE — Progress Notes (Signed)
    08/25/2021 Name: Devin Lucero MRN: [id number] DOB: 1965-10-26   S:  17 yoF Presents for diabetes evaluation, education, and management.  Patient is also a patient of the [facility] and medical records have not been received  Patient was referred and last seen by Primary Care Provider on 06/21/21.  He is a new patient at this office and his past medical history is pertinent for t2dm, htn, hld, seizures and migraines.  Insurance coverage/medication affordability: bcbs/[place]  Patient reports adherence with medications. Current diabetes medications include: ozempic Current hypertension medications include: held due to BP Goal 130/80 Current hyperlipidemia medications include: rosuvastatin   Patient denies hypoglycemic events.  O:  Lab Results  Component Value Date   HGBA1C 6.3 (H) 06/21/2021   Home fasting blood sugars: <130  2 hour post-meal/random blood sugars: <180.  A/P:  Diabetes T2DM currently controlled.  Patient states he continues on Ozempic '1mg'$  weekly with no issues.  Denies personal and family history of Medullary thyroid cancer (MTC) Encouraged patient to reach out if needs arise.  Due to [place], I'm unable to see insurance claims data/fill history. Extensively discussed pathophysiology of diabetes, recommended lifestyle interventions, dietary effects on blood sugar control  -Counseled on s/sx of and management of hypoglycemia    Written patient instructions provided.  Total time in counseling 20 minutes.    Regina Eck, PharmD, BCPS Clinical Pharmacist, [place]  II Phone [phone]

## 2021-09-07 ENCOUNTER — Emergency Department (HOSPITAL_COMMUNITY)
Admission: EM | Admit: 2021-09-07 | Discharge: 2021-09-07 | Disposition: A | Discharge status: Home / Self Care | Payer: No Typology Code available for payment source | Attending: Emergency Medicine | Admitting: Emergency Medicine

## 2021-09-07 ENCOUNTER — Encounter: Payer: Self-pay | Admitting: Family Medicine

## 2021-09-07 ENCOUNTER — Emergency Department (HOSPITAL_COMMUNITY): Payer: No Typology Code available for payment source

## 2021-09-07 ENCOUNTER — Other Ambulatory Visit: Payer: Self-pay

## 2021-09-07 ENCOUNTER — Encounter (HOSPITAL_COMMUNITY): Payer: Self-pay

## 2021-09-07 ENCOUNTER — Ambulatory Visit: Payer: BC Managed Care – PPO | Admitting: Family Medicine

## 2021-09-07 VITALS — BP 82/42 | HR 90 | Temp 98.8°F | Ht 69.0 in | Wt 182.0 lb

## 2021-09-07 DIAGNOSIS — R42 Dizziness and giddiness: Secondary | ICD-10-CM

## 2021-09-07 DIAGNOSIS — R531 Weakness: Secondary | ICD-10-CM | POA: Insufficient documentation

## 2021-09-07 DIAGNOSIS — R519 Headache, unspecified: Secondary | ICD-10-CM | POA: Diagnosis not present

## 2021-09-07 DIAGNOSIS — I951 Orthostatic hypotension: Secondary | ICD-10-CM | POA: Insufficient documentation

## 2021-09-07 DIAGNOSIS — Z5321 Procedure and treatment not carried out due to patient leaving prior to being seen by health care provider: Secondary | ICD-10-CM | POA: Diagnosis not present

## 2021-09-07 DIAGNOSIS — I959 Hypotension, unspecified: Secondary | ICD-10-CM

## 2021-09-07 LAB — URINALYSIS, ROUTINE W REFLEX MICROSCOPIC
Bilirubin Urine: NEGATIVE
Glucose, UA: NEGATIVE mg/dL
Hgb urine dipstick: NEGATIVE
Ketones, ur: NEGATIVE mg/dL
Leukocytes,Ua: NEGATIVE
Nitrite: NEGATIVE
Protein, ur: NEGATIVE mg/dL
Specific Gravity, Urine: 1.009 (ref 1.005–1.030)
pH: 5 (ref 5.0–8.0)

## 2021-09-07 LAB — CBC
HCT: 38.5 % — ABNORMAL LOW (ref 39.0–52.0)
Hemoglobin: 12.8 g/dL — ABNORMAL LOW (ref 13.0–17.0)
MCH: 30.6 pg (ref 26.0–34.0)
MCHC: 33.2 g/dL (ref 30.0–36.0)
MCV: 92.1 fL (ref 80.0–100.0)
Platelets: 247 10*3/uL (ref 150–400)
RBC: 4.18 MIL/uL — ABNORMAL LOW (ref 4.22–5.81)
RDW: 12.1 % (ref 11.5–15.5)
WBC: 7.4 10*3/uL (ref 4.0–10.5)
nRBC: 0 % (ref 0.0–0.2)

## 2021-09-07 LAB — BASIC METABOLIC PANEL
Anion gap: 4 — ABNORMAL LOW (ref 5–15)
BUN: 17 mg/dL (ref 6–20)
CO2: 23 mmol/L (ref 22–32)
Calcium: 9.2 mg/dL (ref 8.9–10.3)
Chloride: 110 mmol/L (ref 98–111)
Creatinine, Ser: 1.17 mg/dL (ref 0.61–1.24)
GFR, Estimated: >60 mL/min (ref >60)
Glucose, Bld: 183 mg/dL — ABNORMAL HIGH (ref 70–99)
Potassium: 4.8 mmol/L (ref 3.5–5.1)
Sodium: 137 mmol/L (ref 135–145)

## 2021-09-07 LAB — CBG MONITORING, ED: Glucose-Capillary: 175 mg/dL — ABNORMAL HIGH (ref 70–99)

## 2021-09-07 NOTE — Progress Notes (Signed)
Subjective:  Patient ID: Devin Lucero, male    DOB: Apr 02, 1965, 56 y.o.   MRN: [id number]  Patient Care Team: Baruch Gouty, FNP as PCP - General (Family Medicine) Gala Romney Cristopher Estimable, MD as Consulting Physician (Gastroenterology)   Chief Complaint:  dizzy, low B/P   HPI: Devin Lucero is a 56 y.o. male presenting on 09/07/2021 for dizzy, low B/P   Pt presents today for evaluation of worsening dizziness and hypotension. He states this started a few days ago and seems to be worsening. He established care with this office 2 months ago. He is very vague with history, medications, and reasons for medications. He is followed by the [facility] system as well. Prior medical records have been requested. He has a history of diabetes, hypertension, hyperlipidemia, partial complex seizures, diabetic neuropathy, migraine headaches, Vit D deficiency, Vit B12 deficiency, GAD, depression, and GERD. At initial visit here, pt was started on low dose ACEi due to diabetes diagnosis. He reports his blood has been fine since starting this medication until this last week. He was seen by the [facility] recently and denies changes in medications.   Dizziness This is a new problem. The current episode started in the past 7 days. The problem occurs intermittently. The problem has been waxing and waning. Associated symptoms include headaches and a visual change. Pertinent negatives include no abdominal pain, anorexia, arthralgias, change in bowel habit, chest pain, chills, congestion, coughing, diaphoresis, fatigue, fever, joint swelling, myalgias, nausea, neck pain, numbness, rash, sore throat, swollen glands, urinary symptoms, vertigo, vomiting or weakness. The symptoms are aggravated by bending, standing, walking, twisting and exertion. He has tried nothing for the symptoms.     Relevant past medical, surgical, family, and social history reviewed and updated as indicated.  Allergies and medications reviewed and updated. Data  reviewed: Chart in Epic.   Past Medical History:  Diagnosis Date   Allergy    Anxiety    Depression    Diabetes mellitus without complication ([place])    GERD (gastroesophageal reflux disease)    Heart attack ([place])    Patient reports mild heart attack around 2018/2019. Stayed at [facility] for a few days. No intervention.   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 No polyps per patient.    Social History   Socioeconomic History   Marital status: Married    Spouse name: Not on file   Number of children: Not on file   Years of education: Not on file   Highest education level: Not on file  Occupational History   Not on file  Tobacco Use   Smoking status: Never   Smokeless tobacco: Not on file  Substance and Sexual Activity   Alcohol use: No   Drug use: Not Currently   Sexual activity: Yes  Other Topics Concern   Not on file  Social History Narrative   Not on file   Social Determinants of Health   Financial Resource Strain: Not on file  Food Insecurity: Not on file  Transportation Needs: Not on file  Physical Activity: Not on file  Stress: Not on file  Social Connections: Not on file  Intimate Partner Violence: Not on file    Outpatient Encounter Medications as of 09/07/2021  Medication Sig   alprostadil (EDEX) 20  MCG injection 20 mcg by Intracavitary route as needed for erectile dysfunction. use no more than 3 times per week   Cholecalciferol 25 MCG (1000 UT) tablet Take 1,000 Units by mouth daily.   clopidogrel (PLAVIX) 75 MG tablet Take 75 mg by mouth daily.   cyanocobalamin (,VITAMIN B-12,) 1000 MCG/ML injection Inject 1,000 mcg into the muscle every 30 (thirty) days.   EPINEPHrine 0.3 mg/0.3 mL IJ SOAJ injection Inject 0.3 mg into the muscle as needed for anaphylaxis.   gabapentin  (NEURONTIN) 300 MG capsule Take 300 mg by mouth 3 (three) times daily.   glucose blood (ONE TOUCH ULTRA TEST) test strip Use to check blood sugar 2 times per day   levETIRAcetam (KEPPRA) 750 MG tablet Take 750 mg by mouth in the morning, at noon, and at bedtime.   lurasidone (LATUDA) 40 MG TABS tablet Take 40 mg by mouth daily with breakfast.   meclizine (ANTIVERT) 25 MG tablet Take 25 mg by mouth 3 (three) times daily as needed for dizziness.   omeprazole (PRILOSEC) 20 MG capsule Take 1 capsule (20 mg total) by mouth 2 (two) times daily before a meal.   polyethylene glycol-electrolytes (NULYTELY) 420 g solution As directed   prazosin (MINIPRESS) 2 MG capsule Take 4 mg by mouth at bedtime.   promethazine (PHENERGAN) 25 MG tablet Take 25 mg by mouth every 6 (six) hours as needed for nausea or vomiting.   QUEtiapine (SEROQUEL) 25 MG tablet Take 25 mg by mouth at bedtime.   rizatriptan (MAXALT) 10 MG tablet Take 10 mg by mouth 2 (two) times daily as needed. May repeat in 2 hours if needed   rosuvastatin (CRESTOR) 40 MG tablet Take 40 mg by mouth daily.   Semaglutide, 1 MG/DOSE, (OZEMPIC, 1 MG/DOSE,) 4 MG/3ML SOPN Inject 1 mg into the skin every Sunday.   sertraline (ZOLOFT) 100 MG tablet Take 150 mg by mouth daily.   topiramate (TOPAMAX) 25 MG tablet Take 50 mg by mouth in the morning, at noon, and at bedtime.   [DISCONTINUED] lisinopril (ZESTRIL) 5 MG tablet Take 1 tablet (5 mg total) by mouth daily.   [DISCONTINUED] predniSONE (DELTASONE) 20 MG tablet 2 po at sametime daily for 5 days- start tomorrow   No facility-administered encounter medications on file as of 09/07/2021.    Allergies  Allergen Reactions   Doxycycline Itching, Rash and Other (See Comments)    flushing   Penicillins Rash and Itching    ITCHING ITCHING    Tetanus Immune Globulin Rash   Tetanus Toxoid Other (See Comments) and Rash    UNLISTED    Tetanus Toxoids Rash and Other (See Comments)    UNLISTED     Review  of Systems  Constitutional:  Positive for activity change and appetite change. Negative for chills, diaphoresis, fatigue, fever and unexpected weight change.  HENT:  Negative for congestion and sore throat.   Eyes:  Positive for visual disturbance. Negative for photophobia.  Respiratory:  Negative for cough and shortness of breath.   Cardiovascular:  Negative for chest pain, palpitations and leg swelling.  Gastrointestinal:  Negative for abdominal pain, anorexia, change in bowel habit, nausea and vomiting.  Genitourinary:  Negative for decreased urine volume and difficulty urinating.  Musculoskeletal:  Negative for arthralgias, joint swelling, myalgias and neck pain.  Skin:  Negative for rash.  Neurological:  Positive for dizziness and headaches. Negative for vertigo, tremors, seizures, syncope, facial asymmetry, speech difficulty, weakness, light-headedness and numbness.  Psychiatric/Behavioral:  Negative for  confusion.   All other systems reviewed and are negative.       Objective:  BP (!) 82/42   Pulse 90   Temp 98.8 F (37.1 C)   Ht 5' 9"  (1.753 m)   Wt 182 lb (82.6 kg)   SpO2 97%   BMI 26.88 kg/m    Wt Readings from Last 3 Encounters:  09/07/21 182 lb (82.6 kg)  08/29/21 173 lb 15.1 oz (78.9 kg)  07/22/21 174 lb (78.9 kg)    Physical Exam Vitals and nursing note reviewed.  Constitutional:      General: He is not in acute distress.    Appearance: He is ill-appearing. He is not toxic-appearing or diaphoretic.  HENT:     Head: Normocephalic and atraumatic.     Mouth/Throat:     Mouth: Mucous membranes are moist.  Eyes:     Conjunctiva/sclera: Conjunctivae normal.     Pupils: Pupils are equal, round, and reactive to light.  Cardiovascular:     Rate and Rhythm: Normal rate and regular rhythm.     Heart sounds: Normal heart sounds.  Pulmonary:     Effort: Pulmonary effort is normal.     Breath sounds: Normal breath sounds.  Musculoskeletal:     Cervical back:  Normal range of motion.     Right lower leg: No edema.     Left lower leg: No edema.  Skin:    General: Skin is warm and dry.     Capillary Refill: Capillary refill takes less than 2 seconds.  Neurological:     General: No focal deficit present.     Mental Status: He is alert and oriented to person, place, and time.  Psychiatric:        Mood and Affect: Mood normal.        Behavior: Behavior normal.        Thought Content: Thought content normal.        Judgment: Judgment normal.     Results for orders placed or performed in visit on 06/21/21  CBC with Differential/Platelet  Result Value Ref Range   WBC 7.9 3.4 - 10.8 x10E3/uL   RBC 4.24 4.14 - 5.80 x10E6/uL   Hemoglobin 12.3 (L) 13.0 - 17.7 g/dL   Hematocrit 37.5 37.5 - 51.0 %   MCV 88 79 - 97 fL   MCH 29.0 26.6 - 33.0 pg   MCHC 32.8 31.5 - 35.7 g/dL   RDW 12.6 11.6 - 15.4 %   Platelets 253 150 - 450 x10E3/uL   Neutrophils 72 Not Estab. %   Lymphs 21 Not Estab. %   Monocytes 5 Not Estab. %   Eos 1 Not Estab. %   Basos 1 Not Estab. %   Neutrophils Absolute 5.6 1.4 - 7.0 x10E3/uL   Lymphocytes Absolute 1.7 0.7 - 3.1 x10E3/uL   Monocytes Absolute 0.4 0.1 - 0.9 x10E3/uL   EOS (ABSOLUTE) 0.1 0.0 - 0.4 x10E3/uL   Basophils Absolute 0.0 0.0 - 0.2 x10E3/uL   Immature Granulocytes 0 Not Estab. %   Immature Grans (Abs) 0.0 0.0 - 0.1 x10E3/uL  CMP14+EGFR  Result Value Ref Range   Glucose 161 (H) 70 - 99 mg/dL   BUN 16 6 - 24 mg/dL   Creatinine, Ser 1.24 0.76 - 1.27 mg/dL   eGFR 68 >59 mL/min/1.73   BUN/Creatinine Ratio 13 9 - 20   Sodium 141 134 - 144 mmol/L   Potassium 4.4 3.5 - 5.2 mmol/L   Chloride 109 (H)  96 - 106 mmol/L   CO2 19 (L) 20 - 29 mmol/L   Calcium 9.4 8.7 - 10.2 mg/dL   Total Protein 6.7 6.0 - 8.5 g/dL   Albumin 4.6 3.8 - 4.9 g/dL   Globulin, Total 2.1 1.5 - 4.5 g/dL   Albumin/Globulin Ratio 2.2 1.2 - 2.2   Bilirubin Total 0.3 0.0 - 1.2 mg/dL   Alkaline Phosphatase 102 44 - 121 IU/L   AST 16 0 - 40 IU/L    ALT 9 0 - 44 IU/L  Thyroid Panel With TSH  Result Value Ref Range   TSH 0.844 0.450 - 4.500 uIU/mL   T4, Total 7.2 4.5 - 12.0 ug/dL   T3 Uptake Ratio 30 24 - 39 %   Free Thyroxine Index 2.2 1.2 - 4.9  VITAMIN D 25 Hydroxy (Vit-D Deficiency, Fractures)  Result Value Ref Range   Vit D, 25-Hydroxy 33.8 30.0 - 100.0 ng/mL  PSA, total and free  Result Value Ref Range   Prostate Specific Ag, Serum 0.2 0.0 - 4.0 ng/mL   PSA, Free 0.13 N/A ng/mL   PSA, Free Pct 65.0 %  Bayer DCA Hb A1c Waived  Result Value Ref Range   HB A1C (BAYER DCA - WAIVED) 6.3 (H) 4.8 - 5.6 %  Microalbumin / creatinine urine ratio  Result Value Ref Range   Creatinine, Urine 157.5 Not Estab. mg/dL   Microalbumin, Urine 15.8 Not Estab. ug/mL   Microalb/Creat Ratio 10 0 - 29 mg/g creat  Urinalysis, Routine w reflex microscopic  Result Value Ref Range   Specific Gravity, UA 1.025 1.005 - 1.030   pH, UA 5.0 5.0 - 7.5   Color, UA Yellow Yellow   Appearance Ur Clear Clear   Leukocytes,UA Negative Negative   Protein,UA Negative Negative/Trace   Glucose, UA Negative Negative   Ketones, UA Negative Negative   RBC, UA Negative Negative   Bilirubin, UA Negative Negative   Urobilinogen, Ur 0.2 0.2 - 1.0 mg/dL   Nitrite, UA Negative Negative  Vitamin B12  Result Value Ref Range   Vitamin B-12 274 232 - 1,245 pg/mL  Levetiracetam level  Result Value Ref Range   Levetiracetam Lvl 6.8 (L) 10.0 - 40.0 ug/mL       Pertinent labs & imaging results that were available during my care of the patient were reviewed by me and considered in my medical decision making.  Assessment & Plan:  Elishua was seen today for dizzy, low b/p.  Diagnoses and all orders for this visit:  Acute hypotension Dizziness Hypotension with significant orthostatic changes, symptomatic. Pt taken to ED for evaluation and treatment due to symptomatic hypotension.     Continue all other maintenance medications.  Follow up plan: ED for  evaluation and treatment    The above assessment and management plan was discussed with the patient. The patient verbalized understanding of and has agreed to the management plan. Patient is aware to call the clinic if they develop any new symptoms or if symptoms persist or worsen. Patient is aware when to return to the clinic for a follow-up visit. Patient educated on when it is appropriate to go to the emergency department.   Monia Pouch, FNP-C [facility] [phone]

## 2021-09-07 NOTE — ED Notes (Signed)
Pt and family upset and wants to eat. Will advise PA who saw him earlier.

## 2021-09-07 NOTE — ED Provider Triage Note (Signed)
Emergency Medicine Provider Triage Evaluation Note  Devin Lucero , a 56 y.o. male  was evaluated in triage.  Pt complains of new onset generalized weakness, dizziness for the last 5 days.  Patient with orthostatic hypotension PCP office, instructed to come to the ED.  He has not had any fever, cough.  He reports history of migraines, some improvement of headache that he says since Saturday with his migraine medicine, reports that he is still having some headache today.  He reports that he feels that he is somewhat off balance.  He denies any blurry vision, double vision.  Review of Systems  Positive: Weakness, dizziness, feeling off balance Negative: Vision changes, numbness, tingling  Physical Exam  BP 102/69 (BP Location: Right Arm)   Pulse 80   Temp 98.1 F (36.7 C) (Oral)   Resp 16   Ht '5\' 9"'$  (1.753 m)   Wt 82.6 kg   SpO2 100%   BMI 26.88 kg/m  Gen:   Awake, no distress   Resp:  Normal effort  MSK:   Moves extremities without difficulty  Other:  CN II through XII grossly intact  Medical Decision Making  Medically screening exam initiated at 1:28 PM.  Appropriate orders placed.  Brodyn Depuy was informed that the remainder of the evaluation will be completed by another provider, this initial triage assessment does not replace that evaluation, and the importance of remaining in the ED until their evaluation is complete.  Work-up initiated   Anselmo Pickler, [place] 09/07/21 1329

## 2021-09-07 NOTE — ED Triage Notes (Addendum)
Pt to ED via POV from PCP c/o generalized weakness and diziness x 5 days. Reports positive for orthostatic hypotension at PCP office and was instructed to come to ED.  Denies fever, cough . Reports hx of migraines.

## 2021-09-08 ENCOUNTER — Telehealth: Payer: Self-pay | Admitting: Family Medicine

## 2021-09-08 NOTE — Telephone Encounter (Signed)
Advised patient when to hold B/P medication. Advised patient to contact [place] records and see if he can get records sent to [facility]. Gave fax number for our clinic

## 2021-10-04 ENCOUNTER — Other Ambulatory Visit: Payer: Self-pay

## 2021-10-04 ENCOUNTER — Encounter (HOSPITAL_COMMUNITY)
Admission: RE | Admit: 2021-10-04 | Discharge: 2021-10-04 | Disposition: A | Discharge status: Home / Self Care | Payer: No Typology Code available for payment source | Source: Ambulatory Visit | Attending: Internal Medicine | Admitting: Internal Medicine

## 2021-10-04 ENCOUNTER — Encounter (HOSPITAL_COMMUNITY): Payer: Self-pay

## 2021-10-07 ENCOUNTER — Ambulatory Visit (HOSPITAL_COMMUNITY)
Admission: RE | Admit: 2021-10-07 | Discharge: 2021-10-07 | Disposition: A | Discharge status: Home / Self Care | Payer: BC Managed Care – PPO | Attending: Internal Medicine | Admitting: Internal Medicine

## 2021-10-07 ENCOUNTER — Ambulatory Visit (HOSPITAL_COMMUNITY): Payer: BC Managed Care – PPO | Admitting: Certified Registered"

## 2021-10-07 ENCOUNTER — Encounter (HOSPITAL_COMMUNITY): Payer: Self-pay | Admitting: Internal Medicine

## 2021-10-07 ENCOUNTER — Encounter (HOSPITAL_COMMUNITY)
Admission: RE | Disposition: A | Discharge status: Home / Self Care | Payer: Self-pay | Source: Home / Self Care | Attending: Internal Medicine

## 2021-10-07 DIAGNOSIS — G4730 Sleep apnea, unspecified: Secondary | ICD-10-CM | POA: Insufficient documentation

## 2021-10-07 DIAGNOSIS — Z1212 Encounter for screening for malignant neoplasm of rectum: Secondary | ICD-10-CM

## 2021-10-07 DIAGNOSIS — K219 Gastro-esophageal reflux disease without esophagitis: Secondary | ICD-10-CM | POA: Insufficient documentation

## 2021-10-07 DIAGNOSIS — F32A Depression, unspecified: Secondary | ICD-10-CM | POA: Insufficient documentation

## 2021-10-07 DIAGNOSIS — I10 Essential (primary) hypertension: Secondary | ICD-10-CM | POA: Diagnosis not present

## 2021-10-07 DIAGNOSIS — G43909 Migraine, unspecified, not intractable, without status migrainosus: Secondary | ICD-10-CM | POA: Insufficient documentation

## 2021-10-07 DIAGNOSIS — D122 Benign neoplasm of ascending colon: Secondary | ICD-10-CM | POA: Insufficient documentation

## 2021-10-07 DIAGNOSIS — E119 Type 2 diabetes mellitus without complications: Secondary | ICD-10-CM | POA: Diagnosis not present

## 2021-10-07 DIAGNOSIS — K5730 Diverticulosis of large intestine without perforation or abscess without bleeding: Secondary | ICD-10-CM | POA: Diagnosis not present

## 2021-10-07 DIAGNOSIS — Z7985 Long-term (current) use of injectable non-insulin antidiabetic drugs: Secondary | ICD-10-CM | POA: Insufficient documentation

## 2021-10-07 DIAGNOSIS — F419 Anxiety disorder, unspecified: Secondary | ICD-10-CM | POA: Insufficient documentation

## 2021-10-07 DIAGNOSIS — Z1211 Encounter for screening for malignant neoplasm of colon: Secondary | ICD-10-CM | POA: Diagnosis not present

## 2021-10-07 DIAGNOSIS — R569 Unspecified convulsions: Secondary | ICD-10-CM | POA: Diagnosis not present

## 2021-10-07 DIAGNOSIS — K635 Polyp of colon: Secondary | ICD-10-CM | POA: Diagnosis not present

## 2021-10-07 HISTORY — PX: COLONOSCOPY WITH PROPOFOL: SHX5780

## 2021-10-07 HISTORY — PX: POLYPECTOMY: SHX5525

## 2021-10-07 LAB — GLUCOSE, CAPILLARY: Glucose-Capillary: 92 mg/dL (ref 70–99)

## 2021-10-07 SURGERY — COLONOSCOPY WITH PROPOFOL
Anesthesia: General

## 2021-10-07 MED ORDER — LACTATED RINGERS IV SOLN: INTRAVENOUS | Status: DC

## 2021-10-07 MED ORDER — PROPOFOL 10 MG/ML IV BOLUS
INTRAVENOUS | Status: DC | PRN
  Administered 2021-10-07: 20 mg via INTRAVENOUS
  Administered 2021-10-07: 60 mg via INTRAVENOUS

## 2021-10-07 MED ORDER — PROPOFOL 500 MG/50ML IV EMUL
INTRAVENOUS | Status: DC | PRN
  Administered 2021-10-07: 200 ug/kg/min via INTRAVENOUS

## 2021-10-07 MED ORDER — LIDOCAINE HCL (CARDIAC) PF 100 MG/5ML IV SOSY
PREFILLED_SYRINGE | INTRAVENOUS | Status: DC | PRN
  Administered 2021-10-07: 50 mg via INTRAVENOUS

## 2021-10-07 NOTE — Discharge Instructions (Signed)
  Colonoscopy Discharge Instructions  Read the instructions outlined below and refer to this sheet in the next few weeks. These discharge instructions provide you with general information on caring for yourself after you leave the hospital. Your doctor may also give you specific instructions. While your treatment has been planned according to the most current medical practices available, unavoidable complications occasionally occur. If you have any problems or questions after discharge, call Dr. Gala Romney at [phone]. ACTIVITY You may resume your regular activity, but move at a slower pace for the next 24 hours.  Take frequent rest periods for the next 24 hours.  Walking will help get rid of the air and reduce the bloated feeling in your belly (abdomen).  No driving for 24 hours (because of the medicine (anesthesia) used during the test).   Do not sign any important legal documents or operate any machinery for 24 hours (because of the anesthesia used during the test).  NUTRITION Drink plenty of fluids.  You may resume your normal diet as instructed by your doctor.  Begin with a light meal and progress to your normal diet. Heavy or fried foods are harder to digest and may make you feel sick to your stomach (nauseated).  Avoid alcoholic beverages for 24 hours or as instructed.  MEDICATIONS You may resume your normal medications unless your doctor tells you otherwise.  WHAT YOU CAN EXPECT TODAY Some feelings of bloating in the abdomen.  Passage of more gas than usual.  Spotting of blood in your stool or on the toilet paper.  IF YOU HAD POLYPS REMOVED DURING THE COLONOSCOPY: No aspirin products for 7 days or as instructed.  No alcohol for 7 days or as instructed.  Eat a soft diet for the next 24 hours.  FINDING OUT THE RESULTS OF YOUR TEST Not all test results are available during your visit. If your test results are not back during the visit, make an appointment with your caregiver to find out the  results. Do not assume everything is normal if you have not heard from your caregiver or the medical facility. It is important for you to follow up on all of your test results.  SEEK IMMEDIATE MEDICAL ATTENTION IF: You have more than a spotting of blood in your stool.  Your belly is swollen (abdominal distention).  You are nauseated or vomiting.  You have a temperature over 101.  You have abdominal pain or discomfort that is severe or gets worse throughout the day.      1 polyp removed today  Colon polyp and diverticulosis information provided  Further recommendations to follow pending review of pathology report  At patient request I called Alycia at [phone] -reviewed findings and recommendations

## 2021-10-07 NOTE — Transfer of Care (Signed)
Immediate Anesthesia Transfer of Care Note  Patient: Devin Lucero  Procedure(s) Performed: COLONOSCOPY WITH PROPOFOL POLYPECTOMY  Patient Location: PACU  Anesthesia Type:MAC  Level of Consciousness: drowsy and patient cooperative  Airway & Oxygen Therapy: Patient Spontanous Breathing  Post-op Assessment: Report given to RN and Post -op Vital signs reviewed and stable  Post vital signs: Reviewed and stable  Last Vitals:  Vitals Value Taken Time  BP 83/53 10/07/21 0841  Temp 36.4 C 10/07/21 0841  Pulse 61 10/07/21 0841  Resp 13 10/07/21 0841  SpO2 95 % 10/07/21 0841    Last Pain:  Vitals:   10/07/21 0841  TempSrc: Axillary  PainSc: 0-No pain      Patients Stated Pain Goal: 5 (92/33/00 7622)  Complications: No notable events documented.

## 2021-10-07 NOTE — Anesthesia Preprocedure Evaluation (Signed)
Anesthesia Evaluation  Patient identified by MRN, date of birth, ID band Patient awake    Reviewed: Allergy & Precautions, H&P , NPO status , Patient's Chart, lab work & pertinent test results, reviewed documented beta blocker date and time   Airway Mallampati: II  TM Distance: >3 FB Neck ROM: full    Dental no notable dental hx.    Pulmonary sleep apnea ,    Pulmonary exam normal breath sounds clear to auscultation       Cardiovascular Exercise Tolerance: Good hypertension,  Rhythm:regular Rate:Normal     Neuro/Psych  Headaches, Seizures -, Well Controlled,  PSYCHIATRIC DISORDERS Anxiety Depression  Neuromuscular disease    GI/Hepatic Neg liver ROS, GERD  Medicated,  Endo/Other  negative endocrine ROSdiabetes  Renal/GU negative Renal ROS  negative genitourinary   Musculoskeletal   Abdominal   Peds  Hematology negative hematology ROS (+)   Anesthesia Other Findings   Reproductive/Obstetrics negative OB ROS                             Anesthesia Physical Anesthesia Plan  ASA: 3  Anesthesia Plan: General   Post-op Pain Management:    Induction:   PONV Risk Score and Plan: Propofol infusion  Airway Management Planned:   Additional Equipment:   Intra-op Plan:   Post-operative Plan:   Informed Consent: I have reviewed the patients History and Physical, chart, labs and discussed the procedure including the risks, benefits and alternatives for the proposed anesthesia with the patient or authorized representative who has indicated his/her understanding and acceptance.     Dental Advisory Given  Plan Discussed with: CRNA  Anesthesia Plan Comments:         Anesthesia Quick Evaluation

## 2021-10-07 NOTE — Op Note (Signed)
[facility] ([place] Patient Name: Devin Lucero Procedure Date: 10/07/2021 7:55 AM MRN: [id number] Date of Birth: [birth year] Attending MD: Norvel Richards , MD CSN: [id number] Age: 56 Admit Type: Outpatient Procedure:                Colonoscopy Indications:              Screening for colorectal malignant neoplasm Providers:                Norvel Richards, MD, Caprice Kluver, Rosina Lowenstein,                            RN, Ladoris Gene Technician, Technician Referring MD:              Medicines:                Propofol per Anesthesia Complications:            No immediate complications. Estimated Blood Loss:     Estimated blood loss was minimal. Procedure:                Pre-Anesthesia Assessment:                           - Prior to the procedure, a History and Physical                            was performed, and patient medications and                            allergies were reviewed. The patient's tolerance of                            previous anesthesia was also reviewed. The risks                            and benefits of the procedure and the sedation                            options and risks were discussed with the patient.                            All questions were answered, and informed consent                            was obtained. Prior Anticoagulants: The patient has                            taken no previous anticoagulant or antiplatelet                            agents. ASA Grade Assessment: II - A patient with                            mild systemic disease. After reviewing the risks  and benefits, the patient was deemed in                            satisfactory condition to undergo the procedure.                           After obtaining informed consent, the colonoscope                            was passed under direct vision. Throughout the                            procedure, the patient's blood pressure, pulse, and                             oxygen saturations were monitored continuously. The                            [id number]) scope was introduced through the                            anus and advanced to the the cecum, identified by                            appendiceal orifice and ileocecal valve. The                            colonoscopy was performed without difficulty. The                            patient tolerated the procedure well. The quality                            of the bowel preparation was good. The ileocecal                            valve, appendiceal orifice, and rectum were                            photographed. The entire colon was well visualized. Scope In: 8:19:11 AM Scope Out: 8:35:18 AM Scope Withdrawal Time: 0 hours 9 minutes 20 seconds  Total Procedure Duration: 0 hours 16 minutes 7 seconds  Findings:      The perianal and digital rectal examinations were normal.      A 7 mm polyp was found in the ascending colon. The polyp was sessile.       The polyp was removed with a cold snare. Resection and retrieval were       complete. Estimated blood loss was minimal.      Scattered small-mouthed diverticula were found in the sigmoid colon and       descending colon.      The exam was otherwise without abnormality on direct and retroflexion       views. Impression:               - One 7 mm  polyp in the ascending colon, removed                            with a cold snare. Resected and retrieved.                           - Diverticulosis in the sigmoid colon and in the                            descending colon.                           - The examination was otherwise normal on direct                            and retroflexion views. Moderate Sedation:      Moderate (conscious) sedation was personally administered by an       anesthesia professional. The following parameters were monitored: oxygen       saturation, heart rate, blood pressure, respiratory  rate, EKG, adequacy       of pulmonary ventilation, and response to care. Recommendation:           - Patient has a contact number available for                            emergencies. The signs and symptoms of potential                            delayed complications were discussed with the                            patient. Return to normal activities tomorrow.                            Written discharge instructions were provided to the                            patient.                           - Resume previous diet.                           - Continue present medications.                           - Repeat colonoscopy date to be determined after                            pending pathology results are reviewed for                            surveillance.                           - Return to GI office (date not yet determined). Procedure Code(s):        ---  Professional ---                           [phone], Colonoscopy, flexible; with removal of                            tumor(s), polyp(s), or other lesion(s) by snare                            technique Diagnosis Code(s):        --- Professional ---                           Z12.11, Encounter for screening for malignant                            neoplasm of colon                           K63.5, Polyp of colon                           K57.30, Diverticulosis of large intestine without                            perforation or abscess without bleeding CPT copyright 2019 American Medical Association. All rights reserved. The codes documented in this report are preliminary and upon coder review may  be revised to meet current compliance requirements. Cristopher Estimable. Jeanni Allshouse, MD Norvel Richards, MD 10/07/2021 8:46:24 AM This report has been signed electronically. Number of Addenda: 0

## 2021-10-07 NOTE — H&P (Signed)
$'@LOGO'W$ @   Primary Care Physician:  Baruch Gouty, FNP Primary Gastroenterologist:  Dr. Gala Romney  Pre-Procedure History & Physical: HPI:  Devin Lucero is a 56 y.o. male is here for a screening colonoscopy.  No bowel symptoms.  Colonoscopy decades ago no details available.  No family history of colon cancer.  Past Medical History:  Diagnosis Date   Allergy    Anxiety    Depression    Diabetes mellitus without complication ([place])    GERD (gastroesophageal reflux disease)    Heart attack ([place])    Patient reports mild heart attack around 2018/2019. Stayed at [facility] for a few days. No intervention.   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 No polyps per patient.    Prior to Admission medications   Medication Sig Start Date End Date Taking? Authorizing Provider  alprostadil (EDEX) 20 MCG injection 20 mcg by Intracavitary route as needed for erectile dysfunction. use no more than 3 times per week   Yes [provider]  Cholecalciferol 25 MCG (1000 UT) tablet Take 1,000 Units by mouth daily.   Yes [provider]  clopidogrel (PLAVIX) 75 MG tablet Take 75 mg by mouth daily.   Yes [provider]  cyanocobalamin (,VITAMIN B-12,) 1000 MCG/ML injection Inject 1,000 mcg into the muscle every 30 (thirty) days. 04/18/21  Yes [provider]  EPINEPHrine 0.3 mg/0.3 mL IJ SOAJ injection Inject 0.3 mg into the muscle as needed for anaphylaxis. 09/01/19  Yes [provider]  gabapentin (NEURONTIN) 300 MG capsule Take 300 mg by mouth 3 (three) times daily.   Yes [provider]  levETIRAcetam (KEPPRA) 750 MG tablet Take 750 mg by mouth in the morning, at noon, and at bedtime.   Yes [provider]  lurasidone (LATUDA) 40 MG TABS tablet Take 40 mg by mouth daily with  breakfast.   Yes [provider]  meclizine (ANTIVERT) 25 MG tablet Take 25 mg by mouth 3 (three) times daily as needed for dizziness.   Yes [provider]  omeprazole (PRILOSEC) 20 MG capsule Take 1 capsule (20 mg total) by mouth 2 (two) times daily before a meal. 06/21/21 10/07/21 Yes Rakes, Connye Burkitt, FNP  polyethylene glycol-electrolytes (NULYTELY) 420 g solution As directed 07/22/21  Yes Margart Zemanek, Cristopher Estimable, MD  prazosin (MINIPRESS) 2 MG capsule Take 4 mg by mouth at bedtime.   Yes [provider]  QUEtiapine (SEROQUEL) 25 MG tablet Take 25 mg by mouth at bedtime.   Yes [provider]  rizatriptan (MAXALT) 10 MG tablet Take 10 mg by mouth 2 (two) times daily as needed. May repeat in 2 hours if needed   Yes [provider]  rosuvastatin (CRESTOR) 40 MG tablet Take 40 mg by mouth daily.   Yes [provider]  Semaglutide, 1 MG/DOSE, (OZEMPIC, 1 MG/DOSE,) 4 MG/3ML SOPN Inject 1 mg into the skin every Sunday.   Yes [provider]  sertraline (ZOLOFT) 100 MG tablet Take 150 mg by mouth daily.   Yes [provider]  topiramate (TOPAMAX) 25 MG tablet Take 50 mg by mouth in the morning, at noon, and at bedtime. 06/22/20  Yes [provider]  glucose blood (ONE TOUCH ULTRA TEST) test strip Use to check blood sugar 2 times per  day 12/08/14   Renato Shin, MD  promethazine (PHENERGAN) 25 MG tablet Take 25 mg by mouth every 6 (six) hours as needed for nausea or vomiting.    [provider]    Allergies as of 07/22/2021 - Review Complete 07/22/2021  Allergen Reaction Noted   Doxycycline Itching, Other (See Comments), and Rash 10/27/2013   Penicillins Rash and Itching 02/18/2013   Tetanus immune globulin Rash 06/21/2021   Tetanus toxoid Other (See Comments) and Rash 02/18/2013   Tetanus toxoids Rash and Other (See Comments) 10/27/2013    Family History  Problem Relation Age of Onset   Heart disease Father     Hyperlipidemia Father    Hypertension Father    Diabetes Paternal Grandmother    Colon cancer Neg Hx     Social History   Socioeconomic History   Marital status: Married    Spouse name: Not on file   Number of children: Not on file   Years of education: Not on file   Highest education level: Not on file  Occupational History   Not on file  Tobacco Use   Smoking status: Never   Smokeless tobacco: Not on file  Substance and Sexual Activity   Alcohol use: No   Drug use: Not Currently   Sexual activity: Yes  Other Topics Concern   Not on file  Social History Narrative   Not on file   Social Determinants of Health   Financial Resource Strain: Not on file  Food Insecurity: Not on file  Transportation Needs: Not on file  Physical Activity: Not on file  Stress: Not on file  Social Connections: Not on file  Intimate Partner Violence: Not on file    Review of Systems: See HPI, otherwise negative ROS  Physical Exam: BP 113/73   Pulse 72   Temp 97.9 F (36.6 C) (Oral)   Resp 18   SpO2 100%  General:   Alert,  Well-developed, well-nourished, pleasant and cooperative in NAD Head:  Normocephalic and atraumatic. Lungs:  Clear throughout to auscultation.   No wheezes, crackles, or rhonchi. No acute distress. Heart:  Regular rate and rhythm; no murmurs, clicks, rubs,  or gallops. Abdomen:  Soft, nontender and nondistended. No masses, hepatosplenomegaly or hernias noted. Normal bowel sounds, without guarding, and without rebound.    Impression/Plan: Devin Lucero is now here to undergo a screening colonoscopy.  Average risk screening examination.  Risks, benefits, limitations, imponderables and alternatives regarding colonoscopy have been reviewed with the patient. Questions have been answered. All parties agreeable.     Notice:  This dictation was prepared with Dragon dictation along with smaller phrase technology. Any transcriptional errors that result from this process  are unintentional and may not be corrected upon review.

## 2021-10-08 NOTE — Anesthesia Postprocedure Evaluation (Signed)
Anesthesia Post Note  Patient: Axell Trigueros  Procedure(s) Performed: COLONOSCOPY WITH PROPOFOL POLYPECTOMY  Patient location during evaluation: Phase II Anesthesia Type: General Level of consciousness: awake Pain management: pain level controlled Vital Signs Assessment: post-procedure vital signs reviewed and stable Respiratory status: spontaneous breathing and respiratory function stable Cardiovascular status: blood pressure returned to baseline and stable Postop Assessment: no headache and no apparent nausea or vomiting Anesthetic complications: no Comments: Late entry   No notable events documented.   Last Vitals:  Vitals:   10/07/21 0844 10/07/21 0850  BP: (!) 88/58 90/62  Pulse: 64 67  Resp: 14 17  Temp:    SpO2: 95% 96%    Last Pain:  Vitals:   10/07/21 0850  TempSrc:   PainSc: 0-No pain                 Louann Sjogren

## 2021-10-10 ENCOUNTER — Encounter: Payer: Self-pay | Admitting: Internal Medicine

## 2021-10-10 LAB — SURGICAL PATHOLOGY

## 2021-10-17 ENCOUNTER — Encounter (HOSPITAL_COMMUNITY): Payer: Self-pay | Admitting: Internal Medicine

## 2021-10-25 ENCOUNTER — Encounter: Payer: Self-pay | Admitting: Family Medicine

## 2021-10-25 ENCOUNTER — Ambulatory Visit: Payer: BC Managed Care – PPO | Admitting: Family Medicine

## 2021-10-25 VITALS — BP 141/85 | HR 83 | Temp 98.4°F | Ht 69.0 in | Wt 179.2 lb

## 2021-10-25 DIAGNOSIS — E1169 Type 2 diabetes mellitus with other specified complication: Secondary | ICD-10-CM | POA: Diagnosis not present

## 2021-10-25 DIAGNOSIS — E1159 Type 2 diabetes mellitus with other circulatory complications: Secondary | ICD-10-CM

## 2021-10-25 DIAGNOSIS — I152 Hypertension secondary to endocrine disorders: Secondary | ICD-10-CM | POA: Diagnosis not present

## 2021-10-25 DIAGNOSIS — K219 Gastro-esophageal reflux disease without esophagitis: Secondary | ICD-10-CM

## 2021-10-25 LAB — BAYER DCA HB A1C WAIVED: HB A1C (BAYER DCA - WAIVED): 6.8 % — ABNORMAL HIGH (ref 4.8–5.6)

## 2021-10-25 NOTE — Progress Notes (Signed)
Subjective:  Patient ID: Devin Lucero, male    DOB: 19-Jun-1965, 56 y.o.   MRN: [id number]  Patient Care Team: Baruch Gouty, FNP as PCP - General (Family Medicine) Gala Romney Cristopher Estimable, MD as Consulting Physician (Gastroenterology)   Chief Complaint:  Diabetes (3 month follow up)   HPI: Devin Lucero is a 56 y.o. male presenting on 10/25/2021 for Diabetes (3 month follow up)   Pt presents today for 3 month follow up. Since last visit he has stopped Plavix and lisinopril due to low blood pressure readings.   Diabetes He presents for his follow-up diabetic visit. He has type 2 diabetes mellitus. His disease course has been stable. Pertinent negatives for hypoglycemia include no confusion, dizziness, headaches, hunger, mood changes, nervousness/anxiousness, pallor, seizures, sleepiness, speech difficulty, sweats or tremors. Pertinent negatives for diabetes include no blurred vision, no chest pain, no fatigue, no foot paresthesias, no foot ulcerations, no polydipsia, no polyphagia, no polyuria, no visual change, no weakness and no weight loss. Symptoms are stable. Diabetic complications include heart disease. Risk factors for coronary artery disease include diabetes mellitus, dyslipidemia, hypertension and male sex. Current diabetic treatments: Ozempic. He is compliant with treatment all of the time.   Hypertension associated with type 2 diabetes mellitus ([place]) Stopped taking lisinopril due to low blood pressure readings. Has not been monitoring his blood pressure over the last few months. Denies visual changes, chest pain, palpitations, leg swelling, worsening headaches, confusion, weakness, or focal neurological deficits.   Gastroesophageal reflux disease without esophagitis No cough, voice changes, hemoptysis, hematochezia, melena, sore throat, or dysphagia. Has been taking PPI therapy with great control of symptoms.      Relevant past medical, surgical, family, and social history  reviewed and updated as indicated.  Allergies and medications reviewed and updated. Data reviewed: Chart in Epic.   Past Medical History:  Diagnosis Date   Allergy    Anxiety    Depression    Diabetes mellitus without complication ([place])    GERD (gastroesophageal reflux disease)    Heart attack ([place])    Patient reports mild heart attack around 2018/2019. Stayed at [facility] for a few days. No intervention.   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 No polyps per patient.   COLONOSCOPY WITH PROPOFOL N/A 10/07/2021   Procedure: COLONOSCOPY WITH PROPOFOL;  Surgeon: Daneil Dolin, MD;  Location: [facility];  Service: Endoscopy;  Laterality: N/A;  10:00am   POLYPECTOMY  10/07/2021   Procedure: POLYPECTOMY;  Surgeon: Daneil Dolin, MD;  Location: [facility];  Service: Endoscopy;;    Social History   Socioeconomic History   Marital status: Married    Spouse name: Not on file   Number of children: Not on file   Years of education: Not on file   Highest education level: Not on file  Occupational History   Not on file  Tobacco Use   Smoking status: Never   Smokeless tobacco: Not on file  Substance and Sexual Activity   Alcohol use: No   Drug use: Not Currently   Sexual activity: Yes  Other Topics Concern   Not on file  Social History Narrative   Not on file   Social Determinants of Health   Financial Resource Strain: Not on file  Food Insecurity: Not on file  Transportation Needs: Not on file  Physical Activity: Not on file  Stress: Not on file  Social Connections: Not on file  Intimate Partner Violence: Not on file    Outpatient Encounter Medications as of 10/25/2021  Medication Sig   alprostadil (EDEX) 20 MCG injection 20 mcg by Intracavitary route as needed for erectile dysfunction. use  no more than 3 times per week   Cholecalciferol 25 MCG (1000 UT) tablet Take 1,000 Units by mouth daily.   cyanocobalamin (,VITAMIN B-12,) 1000 MCG/ML injection Inject 1,000 mcg into the muscle every 30 (thirty) days.   EPINEPHrine 0.3 mg/0.3 mL IJ SOAJ injection Inject 0.3 mg into the muscle as needed for anaphylaxis.   gabapentin (NEURONTIN) 300 MG capsule Take 300 mg by mouth 3 (three) times daily.   glucose blood (ONE TOUCH ULTRA TEST) test strip Use to check blood sugar 2 times per day   levETIRAcetam (KEPPRA) 750 MG tablet Take 750 mg by mouth in the morning, at noon, and at bedtime.   lurasidone (LATUDA) 40 MG TABS tablet Take 40 mg by mouth daily with breakfast.   meclizine (ANTIVERT) 25 MG tablet Take 25 mg by mouth 3 (three) times daily as needed for dizziness.   prazosin (MINIPRESS) 2 MG capsule Take 4 mg by mouth at bedtime.   promethazine (PHENERGAN) 25 MG tablet Take 25 mg by mouth every 6 (six) hours as needed for nausea or vomiting.   QUEtiapine (SEROQUEL) 25 MG tablet Take 25 mg by mouth at bedtime.   rizatriptan (MAXALT) 10 MG tablet Take 10 mg by mouth 2 (two) times daily as needed. May repeat in 2 hours if needed   rosuvastatin (CRESTOR) 40 MG tablet Take 40 mg by mouth daily.   Semaglutide, 1 MG/DOSE, (OZEMPIC, 1 MG/DOSE,) 4 MG/3ML SOPN Inject 1 mg into the skin every Sunday.   sertraline (ZOLOFT) 100 MG tablet Take 150 mg by mouth daily.   topiramate (TOPAMAX) 25 MG tablet Take 50 mg by mouth in the morning, at noon, and at bedtime.   omeprazole (PRILOSEC) 20 MG capsule Take 1 capsule (20 mg total) by mouth 2 (two) times daily before a meal.   polyethylene glycol-electrolytes (NULYTELY) 420 g solution As directed (Patient not taking: Reported on 10/25/2021)   [DISCONTINUED] clopidogrel (PLAVIX) 75 MG tablet Take 75 mg by mouth daily. (Patient not taking: Reported on 10/25/2021)   No facility-administered encounter medications on file as of 10/25/2021.    Allergies   Allergen Reactions   Doxycycline Itching, Rash and Other (See Comments)    flushing   Penicillins Rash and Itching    ITCHING ITCHING    Tetanus Immune Globulin Rash   Tetanus Toxoid Other (See Comments) and Rash    UNLISTED    Tetanus Toxoids Rash and Other (See Comments)    UNLISTED     Review of Systems  Constitutional:  Negative for activity change, appetite change, chills, diaphoresis, fatigue, fever, unexpected weight change and weight loss.  HENT: Negative.    Eyes: Negative.  Negative for blurred vision, photophobia and visual disturbance.  Respiratory:  Negative for cough, chest tightness and shortness of breath.   Cardiovascular:  Negative for chest pain, palpitations and leg swelling.  Gastrointestinal:  Negative for blood in stool, constipation, diarrhea, nausea and vomiting.  Endocrine: Negative.  Negative for polydipsia, polyphagia and polyuria.  Genitourinary:  Negative for decreased urine volume, difficulty urinating, dysuria, frequency and urgency.  Musculoskeletal:  Negative for arthralgias  and myalgias.  Skin: Negative.  Negative for pallor.  Allergic/Immunologic: Negative.   Neurological:  Negative for dizziness, tremors, seizures, speech difficulty, weakness and headaches.  Hematological: Negative.   Psychiatric/Behavioral:  Negative for confusion, hallucinations, sleep disturbance and suicidal ideas. The patient is not nervous/anxious.   All other systems reviewed and are negative.       Objective:  BP (!) 141/85   Pulse 83   Temp 98.4 F (36.9 C) (Temporal)   Ht 5' 9"  (1.753 m)   Wt 179 lb 3.2 oz (81.3 kg)   SpO2 98%   BMI 26.46 kg/m    Wt Readings from Last 3 Encounters:  10/25/21 179 lb 3.2 oz (81.3 kg)  10/04/21 182 lb 1.6 oz (82.6 kg)  09/07/21 182 lb (82.6 kg)    Physical Exam Vitals and nursing note reviewed.  Constitutional:      General: He is not in acute distress.    Appearance: Normal appearance. He is not ill-appearing,  toxic-appearing or diaphoretic.  HENT:     Head: Normocephalic and atraumatic.     Mouth/Throat:     Mouth: Mucous membranes are moist.  Eyes:     Conjunctiva/sclera: Conjunctivae normal.     Pupils: Pupils are equal, round, and reactive to light.  Cardiovascular:     Rate and Rhythm: Normal rate and regular rhythm.     Heart sounds: Normal heart sounds. No murmur heard.    No friction rub. No gallop.  Pulmonary:     Effort: Pulmonary effort is normal.     Breath sounds: Normal breath sounds.  Musculoskeletal:     Right lower leg: No edema.     Left lower leg: No edema.  Skin:    General: Skin is warm and dry.     Capillary Refill: Capillary refill takes less than 2 seconds.  Neurological:     General: No focal deficit present.     Mental Status: He is alert and oriented to person, place, and time.  Psychiatric:        Mood and Affect: Mood normal.        Behavior: Behavior normal.        Thought Content: Thought content normal.        Judgment: Judgment normal.     Results for orders placed or performed during the hospital encounter of 10/07/21  Glucose, capillary  Result Value Ref Range   Glucose-Capillary 92 70 - 99 mg/dL  Surgical pathology  Result Value Ref Range   SURGICAL PATHOLOGY      SURGICAL PATHOLOGY CASE: [id number] PATIENT: Devin Lucero Surgical Pathology Report     Clinical History: screening     FINAL MICROSCOPIC DIAGNOSIS:  A. COLON, ASCENDING, POLYPECTOMY: - Tubular adenoma(s) - Negative for high-grade dysplasia or malignancy        GROSS DESCRIPTION:  Received in formalin is a 0.6 cm pink-red polyp, bisected, 1 block.  SW 9/022/2023   Final Diagnosis performed by Jaquita Folds, MD.   Electronically signed 10/10/2021 Technical component performed at [facility], [place] [address], [place], [place] [address].  Professional component performed at [facility], [place] [address],  [place], [place] [address].  Immunohistochemistry Technical component (if applicable) was performed at [facility]. [address] [place], [place], [place] [address].   IMMUNOHISTOCHEMISTRY DISCLAIMER (if applicable): Some of these immunohistochemical stains may have been developed and  the performance characteristics determine by [facility]. Some may not have been  cleared or approved by the U.S. Food and Drug Administration. The FDA has determined that such clearance or approval is not necessary. This test is used for clinical purposes. It should not be regarded as investigational or for research. This laboratory is certified under the [place] (CLIA-88) as qualified to perform high complexity clinical laboratory testing.  The controls stained appropriately.        Pertinent labs & imaging results that were available during my care of the patient were reviewed by me and considered in my medical decision making.  Assessment & Plan:  Devin Lucero was seen today for diabetes.  Diagnoses and all orders for this visit:  Type 2 diabetes mellitus with other specified complication, without long-term current use of insulin ([place]) A1C 6.8 in office today. Continue current regimen. Stopped ACEi due to low blood pressure readings. Pt aware we will restart low dose for renal protection. Diet and exercise encouraged. Labs pending.  -     Lipid panel -     CBC with Differential/Platelet -     CMP14+EGFR -     Bayer DCA Hb A1c Waived  Hypertension associated with type 2 diabetes mellitus ([place]) Stopped ACEi due to low blood pressure readings. Pt aware we will restart low dose for renal protection. Diet and exercise encouraged. Labs pending.  -     Lipid panel -     CBC with Differential/Platelet -     CMP14+EGFR  Gastroesophageal reflux disease without esophagitis No red flags. CBC pending. Continue current regimen.  -     CBC  with Differential/Platelet     Continue all other maintenance medications.  Follow up plan: Return in about 3 months (around 01/25/2022), or if symptoms worsen or fail to improve, for DM.   Continue healthy lifestyle choices, including diet (rich in fruits, vegetables, and lean proteins, and low in salt and simple carbohydrates) and exercise (at least 30 minutes of moderate physical activity daily).  Educational handout given for DASH diet, HTN, DM  The above assessment and management plan was discussed with the patient. The patient verbalized understanding of and has agreed to the management plan. Patient is aware to call the clinic if they develop any new symptoms or if symptoms persist or worsen. Patient is aware when to return to the clinic for a follow-up visit. Patient educated on when it is appropriate to go to the emergency department.   Monia Pouch, FNP-C [facility] [phone]

## 2021-10-25 NOTE — Patient Instructions (Addendum)
Goal BP:  For patients younger than 60: Goal BP < 140/90. For patients 60 and older: Goal BP < 150/90. For patients with diabetes: Goal BP < 140/90.  Take your medications faithfully as prescribed. Maintain a healthy weight. Get at least 150 minutes of aerobic exercise per week. Minimize salt intake, less than 2000 mg per day. Minimize alcohol intake.  DASH Eating Plan DASH stands for "Dietary Approaches to Stop Hypertension." The DASH eating plan is a healthy eating plan that has been shown to reduce high blood pressure (hypertension). Additional health benefits may include reducing the risk of type 2 diabetes mellitus, heart disease, and stroke. The DASH eating plan may also help with weight loss.  WHAT DO I NEED TO KNOW ABOUT THE DASH EATING PLAN? For the DASH eating plan, you will follow these general guidelines: Choose foods with a percent daily value for sodium of less than 5% (as listed on the food label). Use salt-free seasonings or herbs instead of table salt or sea salt. Check with your health care provider or pharmacist before using salt substitutes. Eat lower-sodium products, often labeled as "lower sodium" or "no salt added." Eat fresh foods. Eat more vegetables, fruits, and low-fat dairy products. Choose whole grains. Look for the word "whole" as the first word in the ingredient list. Choose fish and skinless chicken or turkey more often than red meat. Limit fish, poultry, and meat to 6 oz (170 g) each day. Limit sweets, desserts, sugars, and sugary drinks. Choose heart-healthy fats. Limit cheese to 1 oz (28 g) per day. Eat more home-cooked food and less restaurant, buffet, and fast food. Limit fried foods. Cook foods using methods other than frying. Limit canned vegetables. If you do use them, rinse them well to decrease the sodium. When eating at a restaurant, ask that your food be prepared with less salt, or no salt if possible.  WHAT FOODS CAN I EAT? Seek help from  a dietitian for individual calorie needs.  Grains Whole grain or whole wheat bread. Brown rice. Whole grain or whole wheat pasta. Quinoa, bulgur, and whole grain cereals. Low-sodium cereals. Corn or whole wheat flour tortillas. Whole grain cornbread. Whole grain crackers. Low-sodium crackers.  Vegetables Fresh or frozen vegetables (raw, steamed, roasted, or grilled). Low-sodium or reduced-sodium tomato and vegetable juices. Low-sodium or reduced-sodium tomato sauce and paste. Low-sodium or reduced-sodium canned vegetables.   Fruits All fresh, canned (in natural juice), or frozen fruits.  Meat and Other Protein Products Ground beef (85% or leaner), grass-fed beef, or beef trimmed of fat. Skinless chicken or turkey. Ground chicken or turkey. Pork trimmed of fat. All fish and seafood. Eggs. Dried beans, peas, or lentils. Unsalted nuts and seeds. Unsalted canned beans.  Dairy Low-fat dairy products, such as skim or 1% milk, 2% or reduced-fat cheeses, low-fat ricotta or cottage cheese, or plain low-fat yogurt. Low-sodium or reduced-sodium cheeses.  Fats and Oils Tub margarines without trans fats. Light or reduced-fat mayonnaise and salad dressings (reduced sodium). Avocado. Safflower, olive, or canola oils. Natural peanut or almond butter.  Other Unsalted popcorn and pretzels. The items listed above may not be a complete list of recommended foods or beverages. Contact your dietitian for more options.  WHAT FOODS ARE NOT RECOMMENDED?  Grains White bread. White pasta. White rice. Refined cornbread. Bagels and croissants. Crackers that contain trans fat.  Vegetables Creamed or fried vegetables. Vegetables in a cheese sauce. Regular canned vegetables. Regular canned tomato sauce and paste. Regular tomato and vegetable juices.    Fruits Dried fruits. Canned fruit in light or heavy syrup. Fruit juice.  Meat and Other Protein Products Fatty cuts of meat. Ribs, chicken wings, bacon, sausage,  bologna, salami, chitterlings, fatback, hot dogs, bratwurst, and packaged luncheon meats. Salted nuts and seeds. Canned beans with salt.  Dairy Whole or 2% milk, cream, half-and-half, and cream cheese. Whole-fat or sweetened yogurt. Full-fat cheeses or blue cheese. Nondairy creamers and whipped toppings. Processed cheese, cheese spreads, or cheese curds.  Condiments Onion and garlic salt, seasoned salt, table salt, and sea salt. Canned and packaged gravies. Worcestershire sauce. Tartar sauce. Barbecue sauce. Teriyaki sauce. Soy sauce, including reduced sodium. Steak sauce. Fish sauce. Oyster sauce. Cocktail sauce. Horseradish. Ketchup and mustard. Meat flavorings and tenderizers. Bouillon cubes. Hot sauce. Tabasco sauce. Marinades. Taco seasonings. Relishes.  Fats and Oils Butter, stick margarine, lard, shortening, ghee, and bacon fat. Coconut, palm kernel, or palm oils. Regular salad dressings.  Other Pickles and olives. Salted popcorn and pretzels.  The items listed above may not be a complete list of foods and beverages to avoid. Contact your dietitian for more information.  WHERE CAN I FIND MORE INFORMATION? National Heart, Lung, and Blood Institute: travelstabloid.com Document Released: 12/22/2010 Document Revised: 05/19/2013 Document Reviewed: 11/06/2012 [facility] Patient Information 2015 [place], [place]. This information is not intended to replace advice given to you by your health care provider. Make sure you discuss any questions you have with your health care provider.   I think that you would greatly benefit from seeing a nutritionist.  If you are interested, please call Dr. Jenne Campus at [phone] to schedule an appointment.    Continue to monitor your blood sugars as we discussed and record them. Bring the log to your next appointment.  Take your medications as directed.    Goal Blood glucose:    Fasting (before meals) = 80 to 130   Within 2  hours of eating = less than 180   Understanding your Hemoglobin A1c: 6.8     Diabetes Mellitus and Nutrition    I think that you would greatly benefit from seeing a nutritionist. If this is something you are interested in, please call Dr Jenne Campus at [phone] to schedule an appointment.   When you have diabetes (diabetes mellitus), it is very important to have healthy eating habits because your blood sugar (glucose) levels are greatly affected by what you eat and drink. Eating healthy foods in the appropriate amounts, at about the same times every day, can help you: Control your blood glucose. Lower your risk of heart disease. Improve your blood pressure. Reach or maintain a healthy weight.  Every person with diabetes is different, and each person has different needs for a meal plan. Your health care provider may recommend that you work with a diet and nutrition specialist (dietitian) to make a meal plan that is best for you. Your meal plan may vary depending on factors such as: The calories you need. The medicines you take. Your weight. Your blood glucose, blood pressure, and cholesterol levels. Your activity level. Other health conditions you have, such as heart or kidney disease.  How do carbohydrates affect me? Carbohydrates affect your blood glucose level more than any other type of food. Eating carbohydrates naturally increases the amount of glucose in your blood. Carbohydrate counting is a method for keeping track of how many carbohydrates you eat. Counting carbohydrates is important to keep your blood glucose at a healthy level, especially if you use insulin or take certain oral diabetes medicines.  It is important to know how many carbohydrates you can safely have in each meal. This is different for every person. Your dietitian can help you calculate how many carbohydrates you should have at each meal and for snack. Foods that contain carbohydrates include: Bread, cereal,  rice, pasta, and crackers. Potatoes and corn. Peas, beans, and lentils. Milk and yogurt. Fruit and juice. Desserts, such as cakes, cookies, ice cream, and candy.  How does alcohol affect me? Alcohol can cause a sudden decrease in blood glucose (hypoglycemia), especially if you use insulin or take certain oral diabetes medicines. Hypoglycemia can be a life-threatening condition. Symptoms of hypoglycemia (sleepiness, dizziness, and confusion) are similar to symptoms of having too much alcohol. If your health care provider says that alcohol is safe for you, follow these guidelines: Limit alcohol intake to no more than 1 drink per day for nonpregnant women and 2 drinks per day for men. One drink equals 12 oz of beer, 5 oz of wine, or 1 oz of hard liquor. Do not drink on an empty stomach. Keep yourself hydrated with water, diet soda, or unsweetened iced tea. Keep in mind that regular soda, juice, and other mixers may contain a lot of sugar and must be counted as carbohydrates.  What are tips for following this plan?  Reading food labels Start by checking the serving size on the label. The amount of calories, carbohydrates, fats, and other nutrients listed on the label are based on one serving of the food. Many foods contain more than one serving per package. Check the total grams (g) of carbohydrates in one serving. You can calculate the number of servings of carbohydrates in one serving by dividing the total carbohydrates by 15. For example, if a food has 30 g of total carbohydrates, it would be equal to 2 servings of carbohydrates. Check the number of grams (g) of saturated and trans fats in one serving. Choose foods that have low or no amount of these fats. Check the number of milligrams (mg) of sodium in one serving. Most people should limit total sodium intake to less than 2,300 mg per day. Always check the nutrition information of foods labeled as "low-fat" or "nonfat". These foods may be  higher in added sugar or refined carbohydrates and should be avoided. Talk to your dietitian to identify your daily goals for nutrients listed on the label.  Shopping Avoid buying canned, premade, or processed foods. These foods tend to be high in fat, sodium, and added sugar. Shop around the outside edge of the grocery store. This includes fresh fruits and vegetables, bulk grains, fresh meats, and fresh dairy.  Cooking Use low-heat cooking methods, such as baking, instead of high-heat cooking methods like deep frying. Cook using healthy oils, such as olive, canola, or sunflower oil. Avoid cooking with butter, cream, or high-fat meats.  Meal planning Eat meals and snacks regularly, preferably at the same times every day. Avoid going long periods of time without eating. Eat foods high in fiber, such as fresh fruits, vegetables, beans, and whole grains. Talk to your dietitian about how many servings of carbohydrates you can eat at each meal. Eat 4-6 ounces of lean protein each day, such as lean meat, chicken, fish, eggs, or tofu. 1 ounce is equal to 1 ounce of meat, chicken, or fish, 1 egg, or 1/4 cup of tofu. Eat some foods each day that contain healthy fats, such as avocado, nuts, seeds, and fish.  Lifestyle  Check your blood glucose regularly.  Exercise at least 30 minutes 5 or more days each week, or as told by your health care provider. Take medicines as told by your health care provider. Do not use any products that contain nicotine or tobacco, such as cigarettes and e-cigarettes. If you need help quitting, ask your health care provider. Work with a Social worker or diabetes educator to identify strategies to manage stress and any emotional and social challenges.  What are some questions to ask my health care provider? Do I need to meet with a diabetes educator? Do I need to meet with a dietitian? What number can I call if I have questions? When are the best times to check my blood  glucose?  Where to find more information: American Diabetes Association: diabetes.org/food-and-fitness/food Academy of Nutrition and Dietetics: PokerClues.dk [employer] of Diabetes and Digestive and Kidney Diseases (NIH): ContactWire.be  Summary A healthy meal plan will help you control your blood glucose and maintain a healthy lifestyle. Working with a diet and nutrition specialist (dietitian) can help you make a meal plan that is best for you. Keep in mind that carbohydrates and alcohol have immediate effects on your blood glucose levels. It is important to count carbohydrates and to use alcohol carefully. This information is not intended to replace advice given to you by your health care provider. Make sure you discuss any questions you have with your health care provider. Document Released: 09/29/2004 Document Revised: 02/07/2016 Document Reviewed: 02/07/2016 Elsevier Interactive Patient Education  [employer].

## 2021-10-26 LAB — CBC WITH DIFFERENTIAL/PLATELET
Basophils Absolute: 0.1 10*3/uL (ref 0.0–0.2)
Basos: 1 %
EOS (ABSOLUTE): 0.1 10*3/uL (ref 0.0–0.4)
Eos: 2 %
Hematocrit: 41.5 % (ref 37.5–51.0)
Hemoglobin: 13.3 g/dL (ref 13.0–17.7)
Immature Grans (Abs): 0 10*3/uL (ref 0.0–0.1)
Immature Granulocytes: 0 %
Lymphocytes Absolute: 2.4 10*3/uL (ref 0.7–3.1)
Lymphs: 26 %
MCH: 29 pg (ref 26.6–33.0)
MCHC: 32 g/dL (ref 31.5–35.7)
MCV: 91 fL (ref 79–97)
Monocytes Absolute: 0.5 10*3/uL (ref 0.1–0.9)
Monocytes: 5 %
Neutrophils Absolute: 6.2 10*3/uL (ref 1.4–7.0)
Neutrophils: 66 %
Platelets: 289 10*3/uL (ref 150–450)
RBC: 4.58 x10E6/uL (ref 4.14–5.80)
RDW: 12.4 % (ref 11.6–15.4)
WBC: 9.3 10*3/uL (ref 3.4–10.8)

## 2021-10-26 LAB — CMP14+EGFR
ALT: 6 IU/L (ref 0–44)
AST: 15 IU/L (ref 0–40)
Albumin/Globulin Ratio: 1.8 (ref 1.2–2.2)
Albumin: 4.8 g/dL (ref 3.8–4.9)
Alkaline Phosphatase: 101 IU/L (ref 44–121)
BUN/Creatinine Ratio: 12 (ref 9–20)
BUN: 13 mg/dL (ref 6–24)
Bilirubin Total: 0.3 mg/dL (ref 0.0–1.2)
CO2: 19 mmol/L — ABNORMAL LOW (ref 20–29)
Calcium: 9.5 mg/dL (ref 8.7–10.2)
Chloride: 109 mmol/L — ABNORMAL HIGH (ref 96–106)
Creatinine, Ser: 1.08 mg/dL (ref 0.76–1.27)
Globulin, Total: 2.7 g/dL (ref 1.5–4.5)
Glucose: 121 mg/dL — ABNORMAL HIGH (ref 70–99)
Potassium: 4.6 mmol/L (ref 3.5–5.2)
Sodium: 143 mmol/L (ref 134–144)
Total Protein: 7.5 g/dL (ref 6.0–8.5)
eGFR: 80 mL/min/{1.73_m2} (ref >59)

## 2021-10-26 LAB — LIPID PANEL
Chol/HDL Ratio: 3.7 ratio (ref 0.0–5.0)
Cholesterol, Total: 145 mg/dL (ref 100–199)
HDL: 39 mg/dL — ABNORMAL LOW (ref >39)
LDL Chol Calc (NIH): 67 mg/dL (ref 0–99)
Triglycerides: 239 mg/dL — ABNORMAL HIGH (ref 0–149)
VLDL Cholesterol Cal: 39 mg/dL (ref 5–40)

## 2021-10-28 DIAGNOSIS — Z23 Encounter for immunization: Secondary | ICD-10-CM | POA: Diagnosis not present

## 2021-12-29 DIAGNOSIS — N529 Male erectile dysfunction, unspecified: Secondary | ICD-10-CM | POA: Diagnosis not present

## 2021-12-29 DIAGNOSIS — N39490 Overflow incontinence: Secondary | ICD-10-CM | POA: Diagnosis not present

## 2022-01-02 DIAGNOSIS — H2513 Age-related nuclear cataract, bilateral: Secondary | ICD-10-CM | POA: Diagnosis not present

## 2022-01-02 DIAGNOSIS — E113393 Type 2 diabetes mellitus with moderate nonproliferative diabetic retinopathy without macular edema, bilateral: Secondary | ICD-10-CM | POA: Diagnosis not present

## 2022-01-02 DIAGNOSIS — H04123 Dry eye syndrome of bilateral lacrimal glands: Secondary | ICD-10-CM | POA: Diagnosis not present

## 2022-01-02 DIAGNOSIS — H524 Presbyopia: Secondary | ICD-10-CM | POA: Diagnosis not present

## 2022-01-25 ENCOUNTER — Ambulatory Visit: Payer: BC Managed Care – PPO | Admitting: Family Medicine

## 2022-02-13 DIAGNOSIS — N400 Enlarged prostate without lower urinary tract symptoms: Secondary | ICD-10-CM | POA: Diagnosis not present

## 2022-02-13 DIAGNOSIS — N3289 Other specified disorders of bladder: Secondary | ICD-10-CM | POA: Diagnosis not present

## 2022-05-22 DIAGNOSIS — K810 Acute cholecystitis: Secondary | ICD-10-CM | POA: Diagnosis not present

## 2022-06-08 DIAGNOSIS — N200 Calculus of kidney: Secondary | ICD-10-CM | POA: Diagnosis not present

## 2022-06-08 DIAGNOSIS — K56601 Complete intestinal obstruction, unspecified as to cause: Secondary | ICD-10-CM | POA: Diagnosis not present

## 2022-06-12 ENCOUNTER — Telehealth: Payer: Self-pay

## 2022-06-12 NOTE — Transitions of Care (Post Inpatient/ED Visit) (Signed)
06/12/2022  Name: Devin Lucero MRN: [id number] DOB: October 15, 1965  Today's TOC FU Call Status: Today's TOC FU Call Status:: Successful TOC FU Call Competed TOC FU Call Complete Date: 06/12/22  Transition Care Management Follow-up Telephone Call Date of Discharge: 06/11/22 Discharge Facility: Other ([facility]) Name of Other ([facility]) Discharge Facility: [facility] Type of Discharge: Inpatient Admission Primary Inpatient Discharge Diagnosis:: bowel blockage How have you been since you were released from the hospital?: Better Any questions or concerns?: No  Items Reviewed: Did you receive and understand the discharge instructions provided?: Yes Medications obtained,verified, and reconciled?: Yes (Medications Reviewed) Any new allergies since your discharge?: No Dietary orders reviewed?: Yes Do you have support at home?: Yes People in Home: spouse  Medications Reviewed Today: Medications Reviewed Today     Reviewed by Sonny Masters, FNP (Family Nurse Practitioner) on 10/25/21 at 1601  Med List Status: <None>   Medication Order Taking? Sig Documenting Provider Last Dose Status Informant  alprostadil (EDEX) 20 MCG injection [id number] Yes 20 mcg by Intracavitary route as needed for erectile dysfunction. use no more than 3 times per week [provider] Taking Active Self  Cholecalciferol 25 MCG (1000 UT) tablet [id number] Yes Take 1,000 Units by mouth daily. [provider] Taking Active Self  cyanocobalamin (,VITAMIN B-12,) 1000 MCG/ML injection [id number] Yes Inject 1,000 mcg into the muscle every 30 (thirty) days. [provider] Taking Active Self  EPINEPHrine 0.3 mg/0.3 mL IJ SOAJ injection [id number] Yes Inject 0.3 mg into the muscle as needed for anaphylaxis. [provider] Taking Active Self  gabapentin (NEURONTIN) 300 MG capsule [id number] Yes Take 300 mg by mouth 3 (three) times daily. [provider] Taking Active Self  glucose  blood (ONE TOUCH ULTRA TEST) test strip [id number] Yes Use to check blood sugar 2 times per day Romero Belling, MD Taking Active Self  levETIRAcetam (KEPPRA) 750 MG tablet [id number] Yes Take 750 mg by mouth in the morning, at noon, and at bedtime. [provider] Taking Active Self  lurasidone (LATUDA) 40 MG TABS tablet [id number] Yes Take 40 mg by mouth daily with breakfast. [provider] Taking Active Self  meclizine (ANTIVERT) 25 MG tablet [id number] Yes Take 25 mg by mouth 3 (three) times daily as needed for dizziness. [provider] Taking Active Self  omeprazole (PRILOSEC) 20 MG capsule [id number]  Take 1 capsule (20 mg total) by mouth 2 (two) times daily before a meal. Sonny Masters, FNP  Expired 10/07/21 2359 Self  polyethylene glycol-electrolytes (NULYTELY) 420 g solution [id number] No As directed  Patient not taking: Reported on 10/25/2021   Corbin Ade, MD Not Taking Active Self  prazosin (MINIPRESS) 2 MG capsule [id number] Yes Take 4 mg by mouth at bedtime. [provider] Taking Active Self  promethazine (PHENERGAN) 25 MG tablet [id number] Yes Take 25 mg by mouth every 6 (six) hours as needed for nausea or vomiting. [provider] Taking Active Self  QUEtiapine (SEROQUEL) 25 MG tablet [id number] Yes Take 25 mg by mouth at bedtime. [provider] Taking Active Self  rizatriptan (MAXALT) 10 MG tablet [id number] Yes Take 10 mg by mouth 2 (two) times daily as needed. May repeat in 2 hours if needed [provider] Taking Active Self  rosuvastatin (CRESTOR) 40 MG tablet [id number] Yes Take 40 mg by mouth daily. [provider] Taking Active Self  Semaglutide, 1 MG/DOSE, (OZEMPIC, 1 MG/DOSE,) 4 MG/3ML SOPN [id number] Yes Inject 1 mg into the skin every Sunday.  [provider] Taking Active Self           Med Note Patsi Sears, Dereck Leep Oct 04, 2021 12:57 PM) Last dose was 09/25/2021. On hold for procedure.  sertraline  (ZOLOFT) 100 MG tablet [id number] Yes Take 150 mg by mouth daily. [provider] Taking Active Self  topiramate (TOPAMAX) 25 MG tablet [id number] Yes Take 50 mg by mouth in the morning, at noon, and at bedtime. [provider] Taking Active Self            Home Care and Equipment/Supplies: Were Home Health Services Ordered?: NA Any new equipment or medical supplies ordered?: NA  Functional Questionnaire: Do you need assistance with bathing/showering or dressing?: No Do you need assistance with meal preparation?: No Do you need assistance with eating?: No Do you have difficulty maintaining continence: No Do you need assistance with getting out of bed/getting out of a chair/moving?: No Do you have difficulty managing or taking your medications?: No  Follow up appointments reviewed: PCP Follow-up appointment confirmed?: NA Specialist Hospital Follow-up appointment confirmed?: NA Do you need transportation to your follow-up appointment?: No Do you understand care options if your condition(s) worsen?: Yes-patient verbalized understanding Patient has transferred to [facility]   SIGNATURE Karena Addison, LPN [facility] Nurse Health Advisor Direct Dial [phone]

## 2022-07-01 IMAGING — RF DG FLUORO GUIDE NDL PLC/BX
1 series · 1 of 1 positions shown · non-contrast
Comparison: none

CLINICAL DATA: Chronic RIGHT shoulder pain, adhesive bursitis, fell
1 year ago, decreased range of motion

EXAM:
RIGHT SHOULDER INJECTION UNDER FLUOROSCOPY
TECHNIQUE: Procedure, risks, benefits and alternatives explained to the
patient.

[Series 1: cp_standard · 0.19mm/px · 1 of 1 slices shown]
[im 1/1]
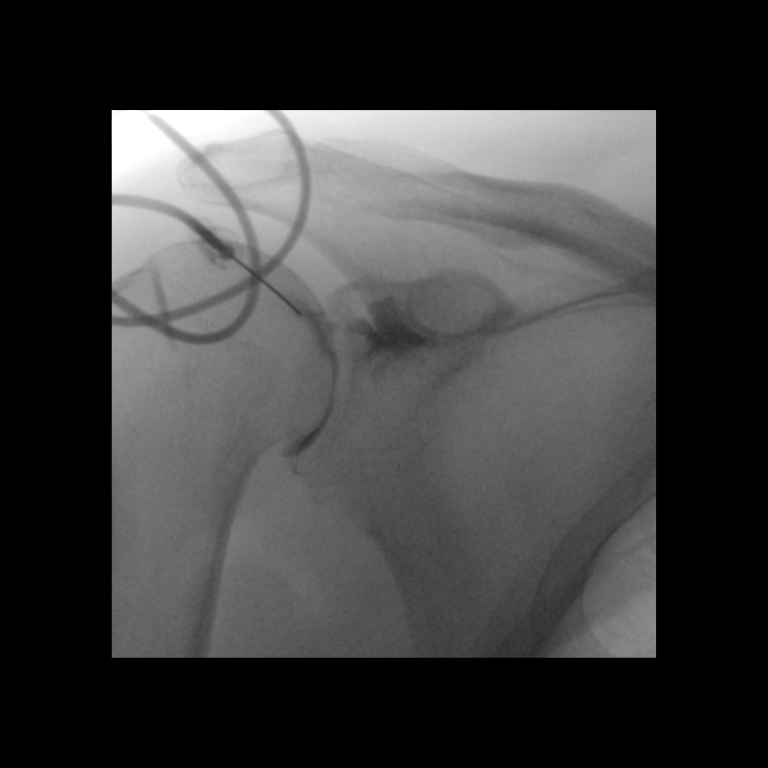

[1 of 1 positions shown; findings below may reference images not displayed]

Patient's questions answered.

Written informed consent obtained.

Timeout protocol followed.

Radiation safety protocol followed.

RIGHT shoulder joint localized by fluoroscopy.

Skin prepped and draped in usual sterile fashion.

Skin and soft tissues anesthetized with 3 mL of 1% lidocaine.

Under fluoroscopic guidance, 22 gauge spinal needle was advanced
into RIGHT shoulder joint.

Intra-articular position of the needle was confirmed with 2 mL of
Omnipaque 300.

Patient was then injected with 3 mL of Marcaine and 40 mg of Kenalog
without difficulty.

Procedure tolerated well by patient without immediate complication.

Patient reported relief of symptoms following a joint injection.

FLUOROSCOPY TIME:  Fluoroscopy Time:  0 minutes 18 seconds

Radiation Exposure Index (if provided by the fluoroscopic device):
1.3 mGy

Number of Acquired Spot Images: 1
FINDINGS: As above
IMPRESSION: Technically successful therapeutic RIGHT shoulder joint injection.

## 2022-08-21 DIAGNOSIS — Z9049 Acquired absence of other specified parts of digestive tract: Secondary | ICD-10-CM | POA: Diagnosis not present

## 2022-08-21 DIAGNOSIS — Z7984 Long term (current) use of oral hypoglycemic drugs: Secondary | ICD-10-CM | POA: Diagnosis not present

## 2022-08-21 DIAGNOSIS — E113593 Type 2 diabetes mellitus with proliferative diabetic retinopathy without macular edema, bilateral: Secondary | ICD-10-CM | POA: Diagnosis not present

## 2022-08-21 DIAGNOSIS — Z7985 Long-term (current) use of injectable non-insulin antidiabetic drugs: Secondary | ICD-10-CM | POA: Diagnosis not present

## 2022-08-21 DIAGNOSIS — Z8719 Personal history of other diseases of the digestive system: Secondary | ICD-10-CM | POA: Diagnosis not present

## 2022-08-21 DIAGNOSIS — R7309 Other abnormal glucose: Secondary | ICD-10-CM | POA: Diagnosis not present

## 2022-08-22 DIAGNOSIS — B351 Tinea unguium: Secondary | ICD-10-CM | POA: Diagnosis not present

## 2022-08-22 DIAGNOSIS — Z7984 Long term (current) use of oral hypoglycemic drugs: Secondary | ICD-10-CM | POA: Diagnosis not present

## 2022-08-22 DIAGNOSIS — E11649 Type 2 diabetes mellitus with hypoglycemia without coma: Secondary | ICD-10-CM | POA: Diagnosis not present

## 2022-08-22 DIAGNOSIS — E1165 Type 2 diabetes mellitus with hyperglycemia: Secondary | ICD-10-CM | POA: Diagnosis not present

## 2022-08-22 DIAGNOSIS — E1140 Type 2 diabetes mellitus with diabetic neuropathy, unspecified: Secondary | ICD-10-CM | POA: Diagnosis not present

## 2022-08-22 DIAGNOSIS — E119 Type 2 diabetes mellitus without complications: Secondary | ICD-10-CM | POA: Diagnosis not present

## 2022-09-12 DIAGNOSIS — Z0100 Encounter for examination of eyes and vision without abnormal findings: Secondary | ICD-10-CM | POA: Diagnosis not present

## 2022-09-20 DIAGNOSIS — N529 Male erectile dysfunction, unspecified: Secondary | ICD-10-CM | POA: Diagnosis not present

## 2022-09-20 DIAGNOSIS — R399 Unspecified symptoms and signs involving the genitourinary system: Secondary | ICD-10-CM | POA: Diagnosis not present

## 2022-09-20 DIAGNOSIS — N200 Calculus of kidney: Secondary | ICD-10-CM | POA: Diagnosis not present

## 2022-09-20 DIAGNOSIS — Z125 Encounter for screening for malignant neoplasm of prostate: Secondary | ICD-10-CM | POA: Diagnosis not present

## 2022-10-02 ENCOUNTER — Telehealth: Payer: Self-pay | Admitting: Family Medicine

## 2022-10-20 DIAGNOSIS — N529 Male erectile dysfunction, unspecified: Secondary | ICD-10-CM | POA: Diagnosis not present

## 2022-11-02 DIAGNOSIS — M25521 Pain in right elbow: Secondary | ICD-10-CM | POA: Diagnosis not present

## 2022-12-11 DIAGNOSIS — H43393 Other vitreous opacities, bilateral: Secondary | ICD-10-CM | POA: Diagnosis not present

## 2022-12-11 DIAGNOSIS — H2513 Age-related nuclear cataract, bilateral: Secondary | ICD-10-CM | POA: Diagnosis not present

## 2022-12-11 DIAGNOSIS — E113311 Type 2 diabetes mellitus with moderate nonproliferative diabetic retinopathy with macular edema, right eye: Secondary | ICD-10-CM | POA: Diagnosis not present

## 2022-12-11 DIAGNOSIS — E113312 Type 2 diabetes mellitus with moderate nonproliferative diabetic retinopathy with macular edema, left eye: Secondary | ICD-10-CM | POA: Diagnosis not present

## 2022-12-22 DIAGNOSIS — R296 Repeated falls: Secondary | ICD-10-CM | POA: Diagnosis not present

## 2022-12-22 DIAGNOSIS — R413 Other amnesia: Secondary | ICD-10-CM | POA: Diagnosis not present

## 2022-12-22 DIAGNOSIS — E1140 Type 2 diabetes mellitus with diabetic neuropathy, unspecified: Secondary | ICD-10-CM | POA: Diagnosis not present

## 2022-12-22 DIAGNOSIS — I10 Essential (primary) hypertension: Secondary | ICD-10-CM | POA: Diagnosis not present

## 2022-12-22 DIAGNOSIS — Z9181 History of falling: Secondary | ICD-10-CM | POA: Diagnosis not present

## 2022-12-25 DIAGNOSIS — Z7985 Long-term (current) use of injectable non-insulin antidiabetic drugs: Secondary | ICD-10-CM | POA: Diagnosis not present

## 2022-12-25 DIAGNOSIS — E119 Type 2 diabetes mellitus without complications: Secondary | ICD-10-CM | POA: Diagnosis not present

## 2023-01-15 ENCOUNTER — Ambulatory Visit (HOSPITAL_BASED_OUTPATIENT_CLINIC_OR_DEPARTMENT_OTHER): Payer: BC Managed Care – PPO | Admitting: Student

## 2023-01-15 ENCOUNTER — Encounter (HOSPITAL_BASED_OUTPATIENT_CLINIC_OR_DEPARTMENT_OTHER): Payer: Self-pay | Admitting: Student

## 2023-01-15 VITALS — BP 137/83 | HR 87 | Temp 98.5°F | Ht 66.93 in | Wt 176.5 lb

## 2023-01-15 DIAGNOSIS — E1159 Type 2 diabetes mellitus with other circulatory complications: Secondary | ICD-10-CM | POA: Diagnosis not present

## 2023-01-15 DIAGNOSIS — F4311 Post-traumatic stress disorder, acute: Secondary | ICD-10-CM | POA: Insufficient documentation

## 2023-01-15 DIAGNOSIS — M542 Cervicalgia: Secondary | ICD-10-CM | POA: Insufficient documentation

## 2023-01-15 DIAGNOSIS — G43909 Migraine, unspecified, not intractable, without status migrainosus: Secondary | ICD-10-CM | POA: Insufficient documentation

## 2023-01-15 DIAGNOSIS — M2550 Pain in unspecified joint: Secondary | ICD-10-CM | POA: Insufficient documentation

## 2023-01-15 DIAGNOSIS — E113319 Type 2 diabetes mellitus with moderate nonproliferative diabetic retinopathy with macular edema, unspecified eye: Secondary | ICD-10-CM | POA: Diagnosis not present

## 2023-01-15 DIAGNOSIS — G4700 Insomnia, unspecified: Secondary | ICD-10-CM | POA: Insufficient documentation

## 2023-01-15 DIAGNOSIS — K810 Acute cholecystitis: Secondary | ICD-10-CM | POA: Insufficient documentation

## 2023-01-15 DIAGNOSIS — K279 Peptic ulcer, site unspecified, unspecified as acute or chronic, without hemorrhage or perforation: Secondary | ICD-10-CM | POA: Insufficient documentation

## 2023-01-15 DIAGNOSIS — R200 Anesthesia of skin: Secondary | ICD-10-CM | POA: Insufficient documentation

## 2023-01-15 DIAGNOSIS — R569 Unspecified convulsions: Secondary | ICD-10-CM | POA: Insufficient documentation

## 2023-01-15 DIAGNOSIS — G4730 Sleep apnea, unspecified: Secondary | ICD-10-CM | POA: Insufficient documentation

## 2023-01-15 DIAGNOSIS — G4733 Obstructive sleep apnea (adult) (pediatric): Secondary | ICD-10-CM | POA: Insufficient documentation

## 2023-01-15 DIAGNOSIS — Z461 Encounter for fitting and adjustment of hearing aid: Secondary | ICD-10-CM | POA: Insufficient documentation

## 2023-01-15 DIAGNOSIS — F5221 Male erectile disorder: Secondary | ICD-10-CM | POA: Insufficient documentation

## 2023-01-15 DIAGNOSIS — M5030 Other cervical disc degeneration, unspecified cervical region: Secondary | ICD-10-CM | POA: Insufficient documentation

## 2023-01-15 DIAGNOSIS — R42 Dizziness and giddiness: Secondary | ICD-10-CM | POA: Insufficient documentation

## 2023-01-15 DIAGNOSIS — A09 Infectious gastroenteritis and colitis, unspecified: Secondary | ICD-10-CM

## 2023-01-15 DIAGNOSIS — Z7689 Persons encountering health services in other specified circumstances: Secondary | ICD-10-CM

## 2023-01-15 DIAGNOSIS — M25521 Pain in right elbow: Secondary | ICD-10-CM | POA: Insufficient documentation

## 2023-01-15 DIAGNOSIS — I152 Hypertension secondary to endocrine disorders: Secondary | ICD-10-CM

## 2023-01-15 DIAGNOSIS — R861 Abnormal level of hormones in specimens from male genital organs: Secondary | ICD-10-CM | POA: Insufficient documentation

## 2023-01-15 DIAGNOSIS — Z9182 Personal history of military deployment: Secondary | ICD-10-CM | POA: Insufficient documentation

## 2023-01-15 DIAGNOSIS — G459 Transient cerebral ischemic attack, unspecified: Secondary | ICD-10-CM | POA: Insufficient documentation

## 2023-01-15 DIAGNOSIS — K56609 Unspecified intestinal obstruction, unspecified as to partial versus complete obstruction: Secondary | ICD-10-CM | POA: Insufficient documentation

## 2023-01-15 DIAGNOSIS — H01009 Unspecified blepharitis unspecified eye, unspecified eyelid: Secondary | ICD-10-CM | POA: Insufficient documentation

## 2023-01-15 DIAGNOSIS — F439 Reaction to severe stress, unspecified: Secondary | ICD-10-CM | POA: Insufficient documentation

## 2023-01-15 DIAGNOSIS — R000 Tachycardia, unspecified: Secondary | ICD-10-CM | POA: Insufficient documentation

## 2023-01-15 HISTORY — DX: Pain in right elbow: M25.521

## 2023-01-15 HISTORY — DX: Unspecified blepharitis unspecified eye, unspecified eyelid: H01.009

## 2023-01-15 HISTORY — DX: Dizziness and giddiness: R42

## 2023-01-15 HISTORY — DX: Infectious gastroenteritis and colitis, unspecified: A09

## 2023-01-15 HISTORY — DX: Unspecified intestinal obstruction, unspecified as to partial versus complete obstruction: K56.609

## 2023-01-15 NOTE — Assessment & Plan Note (Signed)
Take BP record at home and follow at next visit.

## 2023-01-15 NOTE — Progress Notes (Signed)
New Patient Office Visit  Subjective    Patient ID: Devin Lucero, male    DOB: 02-22-1965  Age: 57 y.o. MRN: [id number]  CC:  Chief Complaint  Patient presents with   Establish Care    Here to establish care. Would like a local doctor. [facility]. Has not had a physical.     HPI Devin Lucero presents to establish care. Prior PCP was at the [place]. Last physical was unknown. he notes that he requires refills of none. February moved here from [place].  Seizures and Migraines (multiple types)- On keppra, topamax, Oxcarbazepine. Ruel Favors as well for migraines. No breakthrough seizures. No seizures in 2-3 years. Will have MRI in February for short term memory loss alongside issues with balance- frequent falls, no head hits yet.  Dizziness- on meclizine.  PTSD- on prazosin (nightmares), seroquel, and Zoloft? Sees psych- dr. Augusto Garbe, has therapist if needed.   T2DM- on Ozempic 0.5 mg once weekly. On jardiance. No issues on these. Sees endocrinoloist. Went up to 1 mg but states that it was getting BG too low.   Screenings:  Colon Cancer: Colonoscopy- 1.5 years ago-  3 or 4 polyps. Unsure on follow up. Will record request.  Lung Cancer: No. Diabetes: Last A1c was this month at 7.7. Was getting nauseated on metformin. Was taking for 10 years when he stopped. History of A1c of 16 before getting stable on medications.   HLD: indicated. Was on cholesterol medication in the past. This was discontinued but patient is not sure why. The 10-year ASCVD risk score (Arnett DK, et al., 2019) is: 14.7%  I have spent >30 minutes diagnosing, managing, and charting the patient for this visit.  Outpatient Encounter Medications as of 01/15/2023  Medication Sig   alprostadil (EDEX) 20 MCG injection 20 mcg by Intracavitary route as needed for erectile dysfunction. use no more than 3 times per week   cetirizine (ZYRTEC) 10 MG tablet Take 10 mg by mouth daily.   Cholecalciferol  (VITAMIN D3) 50 MCG (2000 UT) capsule Take 2,000 Units by mouth daily.   cyanocobalamin (,VITAMIN B-12,) 1000 MCG/ML injection Inject 1,000 mcg into the muscle every 30 (thirty) days.   empagliflozin (JARDIANCE) 25 MG TABS tablet Take 25 mg by mouth daily.   EPINEPHrine 0.3 mg/0.3 mL IJ SOAJ injection Inject 0.3 mg into the muscle as needed for anaphylaxis.   escitalopram (LEXAPRO) 20 MG tablet Take 20 mg by mouth daily.   fluticasone (FLONASE) 50 MCG/ACT nasal spray Place 2 sprays into both nostrils daily.   glucose blood (ONE TOUCH ULTRA TEST) test strip Use to check blood sugar 2 times per day   glucose blood test strip 1 each by Other route daily.   levETIRAcetam (KEPPRA) 750 MG tablet Take 750 mg by mouth in the morning, at noon, and at bedtime.   meclizine (ANTIVERT) 25 MG tablet Take 25 mg by mouth 3 (three) times daily as needed for dizziness.   Melatonin 3 MG CAPS Take 3 mg by mouth at bedtime.   omeprazole (PRILOSEC) 20 MG capsule Take 1 capsule (20 mg total) by mouth 2 (two) times daily before a meal. (Patient taking differently: Take 40 mg by mouth daily.)   Oxcarbazepine (TRILEPTAL) 300 MG tablet Take 150 mg by mouth 3 (three) times daily.   prazosin (MINIPRESS) 5 MG capsule Take 5 mg by mouth at bedtime.   promethazine (PHENERGAN) 25 MG tablet Take 25 mg by mouth every 6 (  six) hours as needed for nausea or vomiting.   QUEtiapine (SEROQUEL) 50 MG tablet Take 50 mg by mouth at bedtime.   rizatriptan (MAXALT-MLT) 10 MG disintegrating tablet Take 10 mg by mouth as needed for migraine. May repeat in 2 hours if needed   Semaglutide (OZEMPIC, 0.25 OR 0.5 MG/DOSE, [place]) Inject 0.5 mg into the skin daily.   sertraline (ZOLOFT) 100 MG tablet Take 150 mg by mouth daily.   tamsulosin (FLOMAX) 0.4 MG CAPS capsule Take 0.4 mg by mouth daily.   topiramate (TOPAMAX) 100 MG tablet Take 100 mg by mouth as needed.   Ubrogepant 100 MG TABS Take 100 mg by mouth as needed.   [DISCONTINUED]  Cholecalciferol 25 MCG (1000 UT) tablet Take 1,000 Units by mouth daily. (Patient not taking: Reported on 01/15/2023)   [DISCONTINUED] gabapentin (NEURONTIN) 300 MG capsule Take 600 mg by mouth 3 (three) times daily.   [DISCONTINUED] lurasidone (LATUDA) 40 MG TABS tablet Take 40 mg by mouth daily with breakfast. (Patient not taking: Reported on 01/15/2023)   [DISCONTINUED] polyethylene glycol-electrolytes (NULYTELY) 420 g solution As directed (Patient not taking: Reported on 10/25/2021)   [DISCONTINUED] rizatriptan (MAXALT) 10 MG tablet Take 10 mg by mouth 2 (two) times daily as needed. May repeat in 2 hours if needed (Patient not taking: Reported on 01/15/2023)   [DISCONTINUED] rosuvastatin (CRESTOR) 40 MG tablet Take 40 mg by mouth daily. (Patient not taking: Reported on 01/15/2023)   [DISCONTINUED] Semaglutide, 1 MG/DOSE, (OZEMPIC, 1 MG/DOSE,) 4 MG/3ML SOPN Inject 1 mg into the skin every Sunday. (Patient not taking: Reported on 01/15/2023)   No facility-administered encounter medications on file as of 01/15/2023.    Past Medical History:  Diagnosis Date   Allergy    Anxiety    Blepharitis 01/15/2023   Depression    Diabetes mellitus without complication (HCC)    Dizziness 01/15/2023   GERD (gastroesophageal reflux disease)    Heart attack [facility])    Patient reports mild heart attack around 2018/2019. Stayed at [facility] for a few days. No intervention.   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 No polyps  per patient.   COLONOSCOPY WITH PROPOFOL N/A 10/07/2021   Procedure: COLONOSCOPY WITH PROPOFOL;  Surgeon: Corbin Ade, MD;  [facility];  Service: Endoscopy;  Laterality: N/A;  10:00am   POLYPECTOMY  10/07/2021   Procedure: POLYPECTOMY;  Surgeon: Corbin Ade, MD;  [facility];  Service: Endoscopy;;    Family History  Problem Relation Age of Onset   Breast cancer Mother    Heart Problems Mother    Heart disease Father    Hyperlipidemia Father    Hypertension Father    Diabetes Paternal Grandmother    Colon cancer Neg Hx     Social History   Socioeconomic History   Marital status: Married    Spouse name: Not on file   Number of children: 4   Years of education: Not on file  Highest education level: Not on file  Occupational History   Not on file  Tobacco Use   Smoking status: Never    Passive exposure: Never   Smokeless tobacco: Not on file  Vaping Use   Vaping status: Never Used  Substance and Sexual Activity   Alcohol use: No   Drug use: Not Currently   Sexual activity: Yes  Other Topics Concern   Not on file  Social History Narrative   Not on file   Social Drivers of Health   Financial Resource Strain: Not on file  Food Insecurity: No Food Insecurity (05/24/2020)   Received from [facility], [facility]   Hunger Vital Sign    Worried About Running Out of Food in the Last Year: Never true    Ran Out of Food in the Last Year: Never true  Transportation Needs: Not on file  Physical Activity: Not on file  Stress: No Stress Concern Present (07/01/2020)   Received from [facility], [facility] of Occupational Health - Occupational Stress Questionnaire    Feeling of Stress : Not at all  Social Connections: Unknown (05/30/2021)   [facility] from [facility], [facility]   Social Network    Social Network: Not on file  Intimate Partner Violence: Unknown (04/21/2021)   Received from [facility], [facility]   HITS    Physically Hurt: Not on file    Insult or Talk Down To: Not on file    Threaten Physical Harm: Not on file    Scream or Curse: Not on file    ROS  Per HPI      Objective    BP 137/83 (BP Location: Left Arm, Patient Position: Sitting, Cuff Size: Normal)   Pulse 87   Temp 98.5 F (36.9 C) (Oral)   Ht 5' 6.93" (1.7 m)   Wt 176 lb 8 oz (80.1 kg)   SpO2 96%   BMI 27.70 kg/m   Physical Exam Constitutional:      General: He is not in acute distress.    Appearance: Normal appearance. He is not ill-appearing.  HENT:     Head: Normocephalic and atraumatic.     Right Ear: External ear normal.     Left Ear: External ear normal.     Nose: Nose normal.     Mouth/Throat:     Mouth: Mucous membranes are moist.     Pharynx: Oropharynx is clear.  Eyes:     General: No scleral icterus.    Extraocular Movements: Extraocular movements intact.     Conjunctiva/sclera: Conjunctivae normal.     Pupils: Pupils are equal, round, and reactive to light.  Neck:     Vascular: No carotid bruit.  Cardiovascular:     Rate and Rhythm: Normal rate and regular rhythm.     Pulses: Normal pulses.     Heart sounds: Normal heart sounds. No murmur heard.    No friction rub.  Pulmonary:     Effort: Pulmonary effort is normal. No respiratory distress.     Breath sounds: Normal breath sounds. No wheezing, rhonchi or rales.  Musculoskeletal:        General: Normal range of motion.     Cervical back: Neck supple.     Right lower leg: No edema.     Left lower leg: No edema.  Lymphadenopathy:     Cervical: No cervical adenopathy.  Neurological:     Mental Status: He is alert.  Assessment & Plan:   Encounter to establish care  Hypertension associated with type 2 diabetes mellitus (HCC) Assessment & Plan: Take BP record at home and follow at next visit.   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     Return in about 6 weeks (around 02/26/2023) for Annual Physical.   Teryl Lucy Arsen Mangione, PA-C

## 2023-01-15 NOTE — Patient Instructions (Signed)
It was nice to see you today!  As we discussed in clinic I will follow-up with you in around 6 weeks, please make sure to make an appointment for 1 week prior to get your labs drawn.  Please come to your lab visit fasting-meaning only black coffee or water that morning.  If you have any problems before your next visit feel free to message me via MyChart (minor issues or questions) or call the office, otherwise you may reach out to schedule an office visit.  Thank you! Gerilyn Pilgrim Marshea Wisher, PA-C

## 2023-01-15 NOTE — Assessment & Plan Note (Signed)
 Continue to follow with endocrinology.  Continue current regimen.

## 2023-01-15 NOTE — Assessment & Plan Note (Signed)
Sees psych Has therapist if needed.

## 2023-01-16 DIAGNOSIS — H527 Unspecified disorder of refraction: Secondary | ICD-10-CM | POA: Diagnosis not present

## 2023-01-16 DIAGNOSIS — E113313 Type 2 diabetes mellitus with moderate nonproliferative diabetic retinopathy with macular edema, bilateral: Secondary | ICD-10-CM | POA: Diagnosis not present

## 2023-02-01 DIAGNOSIS — M25521 Pain in right elbow: Secondary | ICD-10-CM | POA: Diagnosis not present

## 2023-02-12 ENCOUNTER — Encounter (HOSPITAL_BASED_OUTPATIENT_CLINIC_OR_DEPARTMENT_OTHER): Payer: Self-pay | Admitting: Student

## 2023-02-12 ENCOUNTER — Ambulatory Visit (HOSPITAL_BASED_OUTPATIENT_CLINIC_OR_DEPARTMENT_OTHER): Payer: Self-pay | Admitting: Student

## 2023-02-12 ENCOUNTER — Ambulatory Visit (HOSPITAL_BASED_OUTPATIENT_CLINIC_OR_DEPARTMENT_OTHER): Payer: BC Managed Care – PPO | Admitting: Student

## 2023-02-12 VITALS — BP 103/65 | HR 84 | Temp 98.1°F | Ht 67.13 in | Wt 176.1 lb

## 2023-02-12 DIAGNOSIS — A084 Viral intestinal infection, unspecified: Secondary | ICD-10-CM

## 2023-02-12 DIAGNOSIS — E1159 Type 2 diabetes mellitus with other circulatory complications: Secondary | ICD-10-CM | POA: Diagnosis not present

## 2023-02-12 DIAGNOSIS — I152 Hypertension secondary to endocrine disorders: Secondary | ICD-10-CM

## 2023-02-12 NOTE — Telephone Encounter (Signed)
Copied from CRM [id number]. Topic: Clinical - Red Word Triage >> Feb 12, 2023  8:57 AM Prudencio Pair wrote: Red Word that prompted transfer to Nurse Triage: Patient states he has had diarrhea & high blood pressure for the last 3 days. Blood pressure reading is 166/90. Wants to see PCP if possible.  Chief Complaint: high bp Symptoms: high bp reading, headache, legs feel like they want to give out Frequency: constant Pertinent Negatives: Patient denies sob, cp, numbness Disposition: [] ED /[] Urgent Care (no appt availability in office) / [x] Appointment(In office/virtual)/ []  [facility]/ [] Home Care/ [] Refused Recommended Disposition /[] [facility]/ []  Follow-up with PCP Additional Notes: Apt made for this am.  States hx of HTN but [facility], today has headache and elevated bp.  Care advice given, instructed to go to er if becomes worse.   Reason for Disposition  [1] Systolic BP  >= 200 OR Diastolic >= 120 AND [2] having NO cardiac or neurologic symptoms  Answer Assessment - Initial Assessment Questions 1. BLOOD PRESSURE: "What is the blood pressure?" "Did you take at least two measurements 5 minutes apart?"     166/90; 159/104 yesterday 2. ONSET: "When did you take your blood pressure?"     This am 3. HOW: "How did you take your blood pressure?" (e.g., automatic home BP monitor, visiting nurse)     automatic 4. HISTORY: "Do you have a history of high blood pressure?"     yes 5. MEDICINES: "Are you taking any medicines for blood pressure?" "Have you missed any doses recently?"     denies 6. OTHER SYMPTOMS: "Do you have any symptoms?" (e.g., blurred vision, chest pain, difficulty breathing, headache, weakness)     Headache, legs are feeling like they are going to give out.  Protocols used: Blood Pressure - High-A-AH

## 2023-02-12 NOTE — Patient Instructions (Signed)
It was nice to see you today!  As we discussed in clinic if you get more dizzy, or develop a persistent fever please head to the ER to get an IV. You appear to have a viral illness of the GI tract, be sure to wash your hands frequently and hydrate with gatorade, pedialyte, or liquid IV and lots of water. I also encourage you to eat soup at your meals to increase hydration. If you still have symptoms in a couple days please let me know.  If you have any problems before your next visit feel free to message me via MyChart (minor issues or questions) or call the office, otherwise you may reach out to schedule an office visit.  Thank you! Gerilyn Pilgrim Fadumo Heng, PA-C

## 2023-02-12 NOTE — Progress Notes (Signed)
Acute Office Visit  Subjective:     Patient ID: Devin Lucero, male    DOB: 1965-06-30, 58 y.o.   MRN: [id number]  Chief Complaint  Patient presents with   Hypertension    Here to establish care. Been having HTN and diarrhea for 3 days. Been trying Liquid IV but not been changing anything. Dizzy and tired the last 2-3 days.     BP concerns and Diarrhea Patient presents to clinic with a 3 day history of elevated BP an diarrhea for the last 3 days. No going out to eat. Unsure if he ate anything. Patient also notes associated "dizziness" and fatigue. Dizziness is non vertiginous- states he is light headed. Patient notes that he has been hydrating with Liquid IV but it has not helped. Has not been urinating like he normally does. Notes that his urine is yellow, notes not brown as in dehydration. Goes about once or twice daily. Frequent diarrhea, notes that it has been worse at night. No fever that he has noticed. Does state that he gets cold. Non-bloody diarrhea. No diarrhea in household or other contacts. No cough, rhinorrhea, or congestion. No nausea or vomiting.    Was taking BP in morning and in evenings, was high every time. Patient notes that this was using a wrist cuff. States that he took his BP at 5 am and it was 166/60. Discussed efficacy of upper arm cuffs tends to be better.  ROS  Per HPI    Objective:    BP 103/65 (BP Location: Right Arm, Patient Position: Sitting, Cuff Size: Normal)   Pulse 84   Temp 98.1 F (36.7 C) (Oral)   Ht 5' 7.13" (1.705 m)   Wt 176 lb 1.6 oz (79.9 kg)   SpO2 96%   BMI 27.48 kg/m    Physical Exam Constitutional:      General: He is not in acute distress.    Appearance: Normal appearance. He is not ill-appearing or diaphoretic.  HENT:     Head: Normocephalic and atraumatic.     Right Ear: External ear normal.     Left Ear: External ear normal.     Mouth/Throat:     Comments: Lips appear dry. Eyes:     General: No scleral icterus.        Right eye: No discharge.        Left eye: No discharge.     Conjunctiva/sclera: Conjunctivae normal.  Neck:     Thyroid: No thyroid mass, thyromegaly or thyroid tenderness.  Cardiovascular:     Rate and Rhythm: Normal rate and regular rhythm.     Pulses: Normal pulses.     Heart sounds: Normal heart sounds. No murmur heard.    No friction rub. No gallop.  Pulmonary:     Effort: Pulmonary effort is normal.     Breath sounds: Normal breath sounds. No wheezing, rhonchi or rales.  Chest:     Chest wall: No tenderness.  Abdominal:     General: There is no distension.     Palpations: Abdomen is soft.     Tenderness: There is abdominal tenderness. There is no guarding.     Comments: Hyperactive bowel sounds. Negative psoas sign, no mcburny point tenderness.  Musculoskeletal:     Right lower leg: No edema.     Left lower leg: No edema.  Lymphadenopathy:     Cervical:     Right cervical: No superficial or posterior cervical adenopathy.    Left cervical: No  superficial cervical adenopathy.  Skin:    General: Skin is warm and dry.     Coloration: Skin is not jaundiced.     Findings: No rash.     Comments: Decreased skin turgor.   Neurological:     General: No focal deficit present.     Mental Status: He is alert and oriented to person, place, and time.     Motor: No weakness.  Psychiatric:        Behavior: Behavior normal.     No results found for any visits on 02/12/23.      Assessment & Plan:   Acute Gastoenteritis -Discussed ER precautions for hydration -Discussed return or messaging precautions -Discussed hydration and recommendations as patient appeared to be quite dehydrated today. -Recommended ER for IV if he feels that he is unable to intake enough fluids PO -I do not recommend abx at this time, I feel that this is likely to be viral and self limiting. -BP does not appear to be elevated at this time, recommended patient to bring in cuff to next visit.  I have  spent greater than 40 minutes charting, educating, diagnosing and managing this patient for this visit.   No follow-ups on file.  Teryl Lucy Dashun Borre, PA-C

## 2023-02-19 ENCOUNTER — Other Ambulatory Visit (INDEPENDENT_AMBULATORY_CARE_PROVIDER_SITE_OTHER): Payer: BC Managed Care – PPO

## 2023-02-19 DIAGNOSIS — E785 Hyperlipidemia, unspecified: Secondary | ICD-10-CM

## 2023-02-19 DIAGNOSIS — E1159 Type 2 diabetes mellitus with other circulatory complications: Secondary | ICD-10-CM | POA: Diagnosis not present

## 2023-02-19 DIAGNOSIS — E1140 Type 2 diabetes mellitus with diabetic neuropathy, unspecified: Secondary | ICD-10-CM

## 2023-02-19 DIAGNOSIS — E1169 Type 2 diabetes mellitus with other specified complication: Secondary | ICD-10-CM | POA: Diagnosis not present

## 2023-02-19 DIAGNOSIS — I152 Hypertension secondary to endocrine disorders: Secondary | ICD-10-CM

## 2023-02-20 ENCOUNTER — Other Ambulatory Visit (HOSPITAL_BASED_OUTPATIENT_CLINIC_OR_DEPARTMENT_OTHER): Payer: Self-pay | Admitting: Student

## 2023-02-20 ENCOUNTER — Encounter (HOSPITAL_BASED_OUTPATIENT_CLINIC_OR_DEPARTMENT_OTHER): Payer: Self-pay | Admitting: Student

## 2023-02-20 DIAGNOSIS — E1169 Type 2 diabetes mellitus with other specified complication: Secondary | ICD-10-CM

## 2023-02-20 LAB — CBC WITH DIFFERENTIAL/PLATELET
Basophils Absolute: 0.1 10*3/uL (ref 0.0–0.2)
Basos: 1 %
EOS (ABSOLUTE): 0.2 10*3/uL (ref 0.0–0.4)
Eos: 2 %
Hematocrit: 37 % — ABNORMAL LOW (ref 37.5–51.0)
Hemoglobin: 12 g/dL — ABNORMAL LOW (ref 13.0–17.7)
Immature Grans (Abs): 0.1 10*3/uL (ref 0.0–0.1)
Immature Granulocytes: 1 %
Lymphocytes Absolute: 1.9 10*3/uL (ref 0.7–3.1)
Lymphs: 18 %
MCH: 29.6 pg (ref 26.6–33.0)
MCHC: 32.4 g/dL (ref 31.5–35.7)
MCV: 91 fL (ref 79–97)
Monocytes Absolute: 0.6 10*3/uL (ref 0.1–0.9)
Monocytes: 6 %
Neutrophils Absolute: 7.5 10*3/uL — ABNORMAL HIGH (ref 1.4–7.0)
Neutrophils: 72 %
Platelets: 321 10*3/uL (ref 150–450)
RBC: 4.06 x10E6/uL — ABNORMAL LOW (ref 4.14–5.80)
RDW: 13.1 % (ref 11.6–15.4)
WBC: 10.3 10*3/uL (ref 3.4–10.8)

## 2023-02-20 LAB — COMPREHENSIVE METABOLIC PANEL
ALT: 9 [IU]/L (ref 0–44)
AST: 18 [IU]/L (ref 0–40)
Albumin: 4.4 g/dL (ref 3.8–4.9)
Alkaline Phosphatase: 110 [IU]/L (ref 44–121)
BUN/Creatinine Ratio: 14 (ref 9–20)
BUN: 16 mg/dL (ref 6–24)
Bilirubin Total: 0.3 mg/dL (ref 0.0–1.2)
CO2: 20 mmol/L (ref 20–29)
Calcium: 9.4 mg/dL (ref 8.7–10.2)
Chloride: 105 mmol/L (ref 96–106)
Creatinine, Ser: 1.18 mg/dL (ref 0.76–1.27)
Globulin, Total: 2.6 g/dL (ref 1.5–4.5)
Glucose: 171 mg/dL — ABNORMAL HIGH (ref 70–99)
Potassium: 4.3 mmol/L (ref 3.5–5.2)
Sodium: 140 mmol/L (ref 134–144)
Total Protein: 7 g/dL (ref 6.0–8.5)
eGFR: 71 mL/min/{1.73_m2} (ref >59)

## 2023-02-20 LAB — LIPID PANEL
Chol/HDL Ratio: 5.6 {ratio} — ABNORMAL HIGH (ref 0.0–5.0)
Cholesterol, Total: 212 mg/dL — ABNORMAL HIGH (ref 100–199)
HDL: 38 mg/dL — ABNORMAL LOW (ref >39)
LDL Chol Calc (NIH): 137 mg/dL — ABNORMAL HIGH (ref 0–99)
Triglycerides: 206 mg/dL — ABNORMAL HIGH (ref 0–149)
VLDL Cholesterol Cal: 37 mg/dL (ref 5–40)

## 2023-02-20 LAB — HEMOGLOBIN A1C
Est. average glucose Bld gHb Est-mCnc: 160 mg/dL
Hgb A1c MFr Bld: 7.2 % — ABNORMAL HIGH (ref 4.8–5.6)

## 2023-02-20 MED ORDER — ATORVASTATIN CALCIUM 20 MG PO TABS: 20.0000 mg | ORAL_TABLET | Freq: Every day | ORAL | 2 refills | Status: DC

## 2023-02-21 ENCOUNTER — Ambulatory Visit (HOSPITAL_BASED_OUTPATIENT_CLINIC_OR_DEPARTMENT_OTHER): Payer: BC Managed Care – PPO | Admitting: Student

## 2023-02-21 ENCOUNTER — Encounter (HOSPITAL_BASED_OUTPATIENT_CLINIC_OR_DEPARTMENT_OTHER): Payer: Self-pay | Admitting: Student

## 2023-02-21 VITALS — BP 135/77 | HR 72 | Temp 98.3°F | Ht 67.0 in | Wt 181.6 lb

## 2023-02-21 DIAGNOSIS — G43E19 Chronic migraine with aura, intractable, without status migrainosus: Secondary | ICD-10-CM | POA: Diagnosis not present

## 2023-02-21 MED ORDER — DEXAMETHASONE SODIUM PHOSPHATE 10 MG/ML IJ SOLN
10.0000 mg | Freq: Once | INTRAMUSCULAR | Status: AC
  Administered 2023-02-21: 10 mg via INTRAMUSCULAR

## 2023-02-21 MED ORDER — ONDANSETRON 4 MG PO TBDP: 4.0000 mg | ORAL_TABLET | Freq: Three times a day (TID) | ORAL | 0 refills | Status: AC | PRN

## 2023-02-21 MED ORDER — ONDANSETRON 4 MG PO TBDP: 4.0000 mg | ORAL_TABLET | Freq: Three times a day (TID) | ORAL | 0 refills | Status: DC | PRN

## 2023-02-21 MED ORDER — KETOROLAC TROMETHAMINE 60 MG/2ML IM SOLN
30.0000 mg | Freq: Once | INTRAMUSCULAR | Status: AC
  Administered 2023-02-21: 30 mg via INTRAMUSCULAR

## 2023-02-21 NOTE — Progress Notes (Signed)
 Acute Office Visit  Subjective:     Patient ID: Devin Lucero, male    DOB: [birth year], 58 y.o.   MRN: [id number]  Chief Complaint  Patient presents with   Migraine    Having migraine since yesterday around 10 A.M. In the afternoon, noticed that when looking outside, everything looked white. Has hx of cluster headaches but it feels different. Headache is on top of head and temples. Tried the [place] 100mg , 1 tablet and it did not help. Had to ride bike back home.    HPI  Patient presents today for an intractable headache, likely migraine for 1.5 days. He also has a history of cluster headaches but states that it feels different. Patient has a history of migraines and states that this is similar to an episode that he had in the past. Was treated in the past by neuro at the [place], was prescribed ubrelvy. Patient notes that he has been sensitive to both light, sound, and has been nauseated. Currently patient has tried ubrelvy 100mg  tablet but it only alleviated symptoms slightly. Location of headache is on top of his head and at his temples. Notes aura, that is like a white sheet over his vision. He drove here today but is able to get his son to drive him home. Denies dizziness. Pain 8/10 today. Patient notes that he has been given a toradol  shot in the past for a migraine and it did help. Notes that he has never had a GI bleed and kidney function has been relatively stable.  Instructed to take benedryl at home, given odt zofran  for nausea. Given IM toradol  30mg  and 10mg  decadron .  He was recently seen for gastroenteritis on 02/12/23. He has since gotten much better and has been able to intake plenty of fluids.  Is planning to start statin therapy once he picks it up from the pharmacy.  ROS Per HPI     Objective:    BP 135/77 (BP Location: Right Arm, Patient Position: Sitting, Cuff Size: Normal)   Pulse 72   Temp 98.3 F (36.8 C) (Oral)   Ht 5' 7 (1.702 m)   Wt 181 lb 9.6 oz (82.4 kg)    SpO2 98%   BMI 28.44 kg/m    Physical Exam Constitutional:      Appearance: Normal appearance. He is not ill-appearing or toxic-appearing.  HENT:     Nose: Nose normal.     Mouth/Throat:     Mouth: Mucous membranes are moist.     Pharynx: Oropharynx is clear.  Eyes:     Conjunctiva/sclera: Conjunctivae normal.  Cardiovascular:     Rate and Rhythm: Normal rate and regular rhythm.     Pulses: Normal pulses.     Heart sounds: Normal heart sounds. No murmur heard.    No friction rub.  Pulmonary:     Effort: Pulmonary effort is normal. No respiratory distress.     Breath sounds: Normal breath sounds. No wheezing, rhonchi or rales.  Musculoskeletal:     Cervical back: Normal range of motion and neck supple.  Skin:    General: Skin is warm and dry.  Neurological:     General: No focal deficit present.     Mental Status: He is alert.  Psychiatric:        Mood and Affect: Mood normal.        Behavior: Behavior normal.     No results found for any visits on 02/21/23.      Assessment &  Plan:   Intractable Migraine- Acute Episode Given IM Decadron  Given IM Toradol  Patient was told to monitor his blood glucose in setting of steroid shot. Ordered ODT Zofran  Patient is to take benedryl when he gets home and get plenty of rest Recommended fluid intake  I have spent greater than 30 minutes charting, educating, diagnosing and managing this patient for this visit.  Return for already scheduled..  Hadli Vandemark T Dayane Hillenburg, PA-C

## 2023-02-21 NOTE — Patient Instructions (Addendum)
 It was nice to see you today!  We have given you a shot of steroid (dexamethasone ) and a strong pain medication (toradol ). As we discussed in clinic I would like for you to get the dissolving zofran  that I have prescribed and take that to help your nausea and take a benedryl when you get home. Please make sure to get some rest amidst all this!  Please understand that the steroid shot will spike your glucose so please be careful with carbohydrates today. This should peak around 10 hours, but it may last a couple of days.  If you have any problems before your next visit feel free to message me via MyChart (minor issues or questions) or call the office, otherwise you may reach out to schedule an office visit.  Thank you! Drew Herman, PA-C

## 2023-02-27 ENCOUNTER — Encounter (HOSPITAL_BASED_OUTPATIENT_CLINIC_OR_DEPARTMENT_OTHER): Payer: BC Managed Care – PPO | Admitting: Student

## 2023-03-01 ENCOUNTER — Ambulatory Visit (HOSPITAL_BASED_OUTPATIENT_CLINIC_OR_DEPARTMENT_OTHER): Payer: BC Managed Care – PPO | Admitting: Student

## 2023-03-01 ENCOUNTER — Encounter (HOSPITAL_BASED_OUTPATIENT_CLINIC_OR_DEPARTMENT_OTHER): Payer: Self-pay | Admitting: Student

## 2023-03-01 VITALS — BP 133/77 | HR 84 | Temp 98.4°F | Ht 67.0 in | Wt 177.3 lb

## 2023-03-01 DIAGNOSIS — E113319 Type 2 diabetes mellitus with moderate nonproliferative diabetic retinopathy with macular edema, unspecified eye: Secondary | ICD-10-CM

## 2023-03-01 DIAGNOSIS — E1169 Type 2 diabetes mellitus with other specified complication: Secondary | ICD-10-CM

## 2023-03-01 DIAGNOSIS — N529 Male erectile dysfunction, unspecified: Secondary | ICD-10-CM | POA: Insufficient documentation

## 2023-03-01 DIAGNOSIS — G43109 Migraine with aura, not intractable, without status migrainosus: Secondary | ICD-10-CM

## 2023-03-01 DIAGNOSIS — Z0000 Encounter for general adult medical examination without abnormal findings: Secondary | ICD-10-CM | POA: Diagnosis not present

## 2023-03-01 DIAGNOSIS — E785 Hyperlipidemia, unspecified: Secondary | ICD-10-CM

## 2023-03-01 DIAGNOSIS — D649 Anemia, unspecified: Secondary | ICD-10-CM

## 2023-03-01 DIAGNOSIS — R61 Generalized hyperhidrosis: Secondary | ICD-10-CM | POA: Insufficient documentation

## 2023-03-01 DIAGNOSIS — Z125 Encounter for screening for malignant neoplasm of prostate: Secondary | ICD-10-CM

## 2023-03-01 NOTE — Assessment & Plan Note (Signed)
Patient is to start statin medication, but is waiting for it to come through the [place]. Patient would not like for me to send in the prescription at this time. Will plan to get lipid panel sometime after starting statin medication.

## 2023-03-01 NOTE — Progress Notes (Signed)
Complete physical exam  Patient: Devin Lucero   DOB: [birth year]   58 y.o. Male  MRN: [id number]  Subjective:    Chief Complaint  Patient presents with   Annual Exam    Here for physical. [facility] is [facility] than [facility] thought. Will update [place]. [facility] out meds and [facility] not [facility].     Devin Lucero is a 58 y.o. male who presents today for a complete physical exam. He reports consuming a general and diabetic  diet. The patient has a physically strenuous job, but has no regular exercise apart from work. 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 He generally feels well. He reports sleeping poorly. States that he lays down at 8pm to 12am and then he is up hourly. Does not fall into a deep sleep throughout the night. Reports quite a bit of night sweats, no lingering cough or red flag symptoms. Has been happening for around a week and a half. He does not have additional problems to discuss today.    Discussed lab results.   Hyperlipidemia- Supposed to be prescribed through the [place]. Recent cholesterol panel was significantly elevated with concurrent Type II Diabetes.  Lipid Panel     Component Value Date/Time   CHOL 212 (H) 02/19/2023 0825   TRIG 206 (H) 02/19/2023 0825   HDL 38 (L) 02/19/2023 0825   CHOLHDL 5.6 (H) 02/19/2023 0825   LDLCALC 137 (H) 02/19/2023 0825   LABVLDL 37 02/19/2023 0825   Erectile dysfunction- currently utilizing alprostadil shots.  Patient wondered about a blood flow examination.  Patient states that main ED problems started when starting psychiatric medications.  Patient was encouraged to discuss this with psychiatrist at the [facility].   Migraines with aura- migraines have increased in frequency.  Patient states that the migraines have been happening have been similar to the ones described in his last visit with me.  Notes that he has had 3-4 within around 2 weeks. Appointment in May with neuro at the [facility]. encouraged patient to discuss this with his  neurologist  Most recent fall risk assessment:    02/12/2023   10:44 AM  Fall Risk   Falls in the past year? 1  Number falls in past yr: 1  Injury with Fall? 0  Risk for fall due to : History of fall(s);Impaired balance/gait;Impaired mobility;Impaired vision  Follow up Falls evaluation completed;Education provided;Falls prevention discussed     Most recent depression screenings:    01/15/2023    3:30 PM 10/25/2021    3:52 PM  PHQ 2/9 Scores  PHQ - 2 Score 2 0  PHQ- 9 Score 9 4      Patient Active Problem List   Diagnosis Date Noted   Erectile dysfunction 03/01/2023   Night sweats 03/01/2023   Anemia 03/01/2023   Abnormal level of hormones in specimens from male genital organs 01/15/2023   TIA (transient ischemic attack) 01/15/2023   Tachycardia 01/15/2023   Sleep apnea 01/15/2023   Seizure (HCC) 01/15/2023   Peptic ulcer 01/15/2023   Numbness of finger 01/15/2023   Neck pain 01/15/2023   Multiple joint pain 01/15/2023   Migraine 01/15/2023   DDD (degenerative disc disease), cervical 01/15/2023   Acute posttraumatic stress disorder following military combat 01/15/2023   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) 01/15/2023   Constipation 07/22/2021   Posttraumatic stress disorder 06/30/2021   Generalized anxiety disorder 06/21/2021   Gastroesophageal reflux disease without esophagitis  06/21/2021   Hypertension associated with type 2 diabetes mellitus (HCC) 06/21/2021   Vitamin D deficiency 06/21/2021   Vitamin B12 deficiency 06/21/2021   Localization-related (focal) (partial) symptomatic epilepsy and epileptic syndromes with complex partial seizures, intractable, without status epilepticus (HCC) 06/21/2021   Bilateral bunions 01/12/2017   Type 2 diabetes mellitus with diabetic neuropathy, unspecified (HCC) 03/14/2015   Hyperlipidemia associated with type 2 diabetes mellitus (HCC) 07/31/2014   Depression, recurrent  (HCC) 07/31/2014   S/P arthroscopy of shoulder 03/30/2014   Impingement syndrome of left shoulder 12/10/2013   Male erectile disorder (CODE) 01/17/2012   Past Medical History:  Diagnosis Date   Allergy    Anxiety    Blepharitis 01/15/2023   Depression    Diabetes mellitus without complication (HCC)    Dizziness 01/15/2023   GERD (gastroesophageal reflux disease)    Heart attack (HCC)    Patient reports mild heart attack around 2018/2019. Stayed at [facility] for a few days. No intervention.   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)    Sleep apnea    Allergies  Allergen Reactions   Doxycycline Itching, Other (See Comments), Rash and Hives    flushing  ITCHING   Penicillins Itching and Rash    ITCHING   Tetanus Immune Globulin Rash   Tetanus Toxoid Other (See Comments) and Rash    UNLISTED    Tetanus Toxoids Rash and Other (See Comments)    UNLISTED    Bee Venom Swelling      Patient Care Team: Hebert Dooling, Ouida Sills as PCP - General (Physician Assistant) Corbin Ade, MD as Consulting Physician (Gastroenterology)   Outpatient Medications Prior to Visit  Medication Sig   alprostadil (EDEX) 20 MCG injection 20 mcg by Intracavitary route as needed for erectile dysfunction. use no more than 3 times per week   atorvastatin (LIPITOR) 20 MG tablet Take 20 mg by mouth daily.   cetirizine (ZYRTEC) 10 MG tablet Take 10 mg by mouth daily.   Cholecalciferol (VITAMIN D3) 50 MCG (2000 UT) capsule Take 2,000 Units by mouth daily.   cyanocobalamin (,VITAMIN B-12,) 1000 MCG/ML injection Inject 1,000 mcg  into the muscle every 30 (thirty) days.   empagliflozin (JARDIANCE) 25 MG TABS tablet Take 25 mg by mouth. 1/2 tablet in the morning   EPINEPHrine 0.3 mg/0.3 mL IJ SOAJ injection Inject 0.3 mg into the muscle as needed for anaphylaxis.   escitalopram (LEXAPRO) 20 MG tablet Take 20 mg by mouth daily.   fluticasone (FLONASE) 50 MCG/ACT nasal spray Place 2 sprays into both nostrils daily.   glucose blood (ONE TOUCH ULTRA TEST) test strip Use to check blood sugar 2 times per day   glucose blood test strip 1 each by Other route daily.   levETIRAcetam (KEPPRA) 750 MG tablet Take 750 mg by mouth in the morning, at noon, and at bedtime.   meclizine (ANTIVERT) 25 MG tablet Take 25 mg by mouth 3 (three) times daily as needed for dizziness.   Melatonin 3 MG CAPS Take 3 mg by mouth at bedtime.   omeprazole (PRILOSEC) 40 MG capsule Take 40 mg  by mouth daily.   ondansetron (ZOFRAN-ODT) 4 MG disintegrating tablet Take 1 tablet (4 mg total) by mouth every 8 (eight) hours as needed for nausea or vomiting.   Oxcarbazepine (TRILEPTAL) 300 MG tablet Take 150 mg by mouth 3 (three) times daily.   prazosin (MINIPRESS) 5 MG capsule Take 5 mg by mouth at bedtime.   QUEtiapine (SEROQUEL) 50 MG tablet Take 50 mg by mouth at bedtime.   rizatriptan (MAXALT-MLT) 10 MG disintegrating tablet Take 10 mg by mouth as needed for migraine. May repeat in 2 hours if needed   Semaglutide (OZEMPIC, 0.25 OR 0.5 MG/DOSE, [place]) Inject 0.5 mg into the skin once a week.   sertraline (ZOLOFT) 100 MG tablet Take 150 mg by mouth daily.   tamsulosin (FLOMAX) 0.4 MG CAPS capsule Take 0.4 mg by mouth daily.   topiramate (TOPAMAX) 100 MG tablet Take 100 mg by mouth as needed.   Ubrogepant 100 MG TABS Take 100 mg by mouth as needed.   No facility-administered medications prior to visit.    ROS   Per HPI     Objective:     BP 133/77 (BP Location: Right Arm, Patient Position: Sitting, Cuff Size: Normal)   Pulse 84   Temp 98.4 F (36.9  C) (Oral)   Ht 5\' 7"  (1.702 m)   Wt 177 lb 4.8 oz (80.4 kg)   SpO2 98%   BMI 27.77 kg/m  BP Readings from Last 3 Encounters:  03/01/23 133/77  02/21/23 135/77  02/12/23 103/65   Wt Readings from Last 3 Encounters:  03/01/23 177 lb 4.8 oz (80.4 kg)  02/21/23 181 lb 9.6 oz (82.4 kg)  02/12/23 176 lb 1.6 oz (79.9 kg)      Physical Exam Constitutional:      General: He is not in acute distress.    Appearance: Normal appearance. He is not ill-appearing.  HENT:     Head: Normocephalic and atraumatic.     Right Ear: External ear normal.     Left Ear: External ear normal.     Nose: Nose normal.     Mouth/Throat:     Mouth: Mucous membranes are moist.     Pharynx: Oropharynx is clear.  Eyes:     General: No scleral icterus.    Extraocular Movements: Extraocular movements intact.     Conjunctiva/sclera: Conjunctivae normal.     Pupils: Pupils are equal, round, and reactive to light.  Neck:     Vascular: No carotid bruit.  Cardiovascular:     Rate and Rhythm: Normal rate and regular rhythm.     Pulses: Normal pulses.     Heart sounds: Normal heart sounds. No murmur heard.    No friction rub.  Pulmonary:     Effort: Pulmonary effort is normal. No respiratory distress.     Breath sounds: Normal breath sounds. No wheezing, rhonchi or rales.  Musculoskeletal:        General: Normal range of motion.     Cervical back: Neck supple.     Right lower leg: No edema.     Left lower leg: No edema.     Right foot: No deformity or Charcot foot.     Left foot: No deformity or Charcot foot.  Feet:     Right foot:     Protective Sensation: 7 sites tested.  2 sites sensed.     Skin integrity: Skin integrity normal. No ulcer, skin breakdown, erythema or warmth.     Toenail Condition: Right  toenails are abnormally thick.     Left foot:     Protective Sensation: 7 sites tested.  2 sites sensed.     Skin integrity: Skin integrity normal. No ulcer, skin breakdown, erythema or warmth.      Toenail Condition: Left toenails are abnormally thick.     Comments: Feeling remains predominantly along arch of bilateral feet Lymphadenopathy:     Cervical: No cervical adenopathy.  Neurological:     Mental Status: He is alert.      No results found for any visits on 03/01/23. Last CBC Lab Results  Component Value Date   WBC 10.3 02/19/2023   HGB 12.0 (L) 02/19/2023   HCT 37.0 (L) 02/19/2023   MCV 91 02/19/2023   MCH 29.6 02/19/2023   RDW 13.1 02/19/2023   PLT 321 02/19/2023   Last metabolic panel Lab Results  Component Value Date   GLUCOSE 171 (H) 02/19/2023   NA 140 02/19/2023   K 4.3 02/19/2023   CL 105 02/19/2023   CO2 20 02/19/2023   BUN 16 02/19/2023   CREATININE 1.18 02/19/2023   EGFR 71 02/19/2023   CALCIUM 9.4 02/19/2023   PROT 7.0 02/19/2023   ALBUMIN 4.4 02/19/2023   LABGLOB 2.6 02/19/2023   AGRATIO 1.8 10/25/2021   BILITOT 0.3 02/19/2023   ALKPHOS 110 02/19/2023   AST 18 02/19/2023   ALT 9 02/19/2023   ANIONGAP 4 (L) 09/07/2021   Last lipids Lab Results  Component Value Date   CHOL 212 (H) 02/19/2023   HDL 38 (L) 02/19/2023   LDLCALC 137 (H) 02/19/2023   TRIG 206 (H) 02/19/2023   CHOLHDL 5.6 (H) 02/19/2023   Last hemoglobin A1c Lab Results  Component Value Date   HGBA1C 7.2 (H) 02/19/2023        Assessment & Plan:    Routine Health Maintenance and Physical Exam  Health Maintenance  Topic Date Due   HIV Screening  Never done   Hepatitis C Screening  Never done   DTaP/Tdap/Td vaccine (1 - Tdap) Never done   Yearly kidney health urinalysis for diabetes  06/22/2022   Pneumococcal Vaccination (2 of 2 - PCV) 05/22/2023   COVID-19 Vaccine (4 - 2024-25 season) 04/17/2023*   Eye exam for diabetics  04/20/2023*   Hemoglobin A1C  08/19/2023   Yearly kidney function blood test for diabetes  02/19/2024   Complete foot exam   02/29/2024   Colon Cancer Screening  10/08/2031   Flu Shot  Completed   Zoster (Shingles) Vaccine  Completed   HPV  Vaccine  Aged Out  *Topic was postponed. The date shown is not the original due date.    He should continue to engage in regular physical activity.  Routine general medical examination at a health care facility  Erectile dysfunction, unspecified erectile dysfunction type Assessment & Plan: Currently utilizing alprostadil injections. I suspect that this is likely worsened by psychiatric medications.  Encouraged patient to discuss this with psychiatrist at the [place].  Orders: -     Testosterone; Future  Hyperlipidemia associated with type 2 diabetes mellitus (HCC) Assessment & Plan: Patient is to start statin medication, but is waiting for it to come through the [place]. Patient would not like for me to send in the prescription at this time. Will plan to get lipid panel sometime after starting statin medication.   Migraine with aura and without status migrainosus, not intractable Assessment & Plan: He states that his migraines have recently increased in frequency.  They  are currently well-controlled with Bernita Raisin provided by neurology.  Encouraged patient to continue to follow with neurology and discuss.   Anemia, unspecified type Assessment & Plan: Normocytic- assess iron, B12 (for which he has a history of), and sed rate for possible anemia of chronic disease.  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 Continue current regimen. Lab Results  Component Value Date   HGBA1C 7.2 (H) 02/19/2023   HGBA1C 6.8 (H) 10/25/2021   HGBA1C 6.3 (H) 06/21/2021     Orders: -     Microalbumin / creatinine urine ratio  Night sweats Assessment &  Plan: Around 1 week of night sweats. No chronic cough or red flag symptoms. Will order thyroid panel to assess for now. Will order psa to assess prostate. No changes in bowel or bladder habits. Last colonoscopy 1 year ago.  Orders: -     TSH + free T4; Future    Return in 3 months (on 05/29/2023).     Teryl Lucy Omer Monter, PA-C

## 2023-03-01 NOTE — Assessment & Plan Note (Signed)
He states that his migraines have recently increased in frequency.  They are currently well-controlled with Bernita Raisin provided by neurology.  Encouraged patient to continue to follow with neurology and discuss.

## 2023-03-01 NOTE — Assessment & Plan Note (Signed)
Continue to follow with endocrinology. Continue current regimen. Lab Results  Component Value Date   HGBA1C 7.2 (H) 02/19/2023   HGBA1C 6.8 (H) 10/25/2021   HGBA1C 6.3 (H) 06/21/2021

## 2023-03-01 NOTE — Patient Instructions (Addendum)
It was nice to see you today!  As we discussed in clinic I will contact you with your labs. Please make a nurses visit next week to get them drawn.  Please ask your psychiatrist about if the zoloft or sertraline could be playing a part in your ED.  If you have any problems before your next visit feel free to message me via MyChart (minor issues or questions) or call the office, otherwise you may reach out to schedule an office visit.  Thank you! Gerilyn Pilgrim Yasuko Lapage, PA-C   Health Maintenance, Male Adopting a healthy lifestyle and getting preventive care are important in promoting health and wellness. Ask your health care provider about: The right schedule for you to have regular tests and exams. Things you can do on your own to prevent diseases and keep yourself healthy. What should I know about diet, weight, and exercise? Eat a healthy diet  Eat a diet that includes plenty of vegetables, fruits, low-fat dairy products, and lean protein. Do not eat a lot of foods that are high in solid fats, added sugars, or sodium. Maintain a healthy weight Body mass index (BMI) is a measurement that can be used to identify possible weight problems. It estimates body fat based on height and weight. Your health care provider can help determine your BMI and help you achieve or maintain a healthy weight. Get regular exercise Get regular exercise. This is one of the most important things you can do for your health. Most adults should: Exercise for at least 150 minutes each week. The exercise should increase your heart rate and make you sweat (moderate-intensity exercise). Do strengthening exercises at least twice a week. This is in addition to the moderate-intensity exercise. Spend less time sitting. Even light physical activity can be beneficial. Watch cholesterol and blood lipids Have your blood tested for lipids and cholesterol at 60 years of age, then have this test every 5 years. You may need to have your  cholesterol levels checked more often if: Your lipid or cholesterol levels are high. You are older than 60 years of age. You are at high risk for heart disease. What should I know about cancer screening? Many types of cancers can be detected early and may often be prevented. Depending on your health history and family history, you may need to have cancer screening at various ages. This may include screening for: Colorectal cancer. Prostate cancer. Skin cancer. Lung cancer. What should I know about heart disease, diabetes, and high blood pressure? Blood pressure and heart disease High blood pressure causes heart disease and increases the risk of stroke. This is more likely to develop in people who have high blood pressure readings or are overweight. Talk with your health care provider about your target blood pressure readings. Have your blood pressure checked: Every 3-5 years if you are 19-58 years of age. Every year if you are 52 years old or older. If you are between the ages of 68 and 65 and are a current or former smoker, ask your health care provider if you should have a one-time screening for abdominal aortic aneurysm (AAA). Diabetes Have regular diabetes screenings. This checks your fasting blood sugar level. Have the screening done: Once every three years after age 50 if you are at a normal weight and have a low risk for diabetes. More often and at a younger age if you are overweight or have a high risk for diabetes. What should I know about preventing infection? Hepatitis B If  you have a higher risk for hepatitis B, you should be screened for this virus. Talk with your health care provider to find out if you are at risk for hepatitis B infection. Hepatitis C Blood testing is recommended for: Everyone born from [birth year] through 1967. Anyone with known risk factors for hepatitis C. Sexually transmitted infections (STIs) You should be screened each year for STIs, including gonorrhea  and chlamydia, if: You are sexually active and are younger than 60 years of age. You are older than 60 years of age and your health care provider tells you that you are at risk for this type of infection. Your sexual activity has changed since you were last screened, and you are at increased risk for chlamydia or gonorrhea. Ask your health care provider if you are at risk. Ask your health care provider about whether you are at high risk for HIV. Your health care provider may recommend a prescription medicine to help prevent HIV infection. If you choose to take medicine to prevent HIV, you should first get tested for HIV. You should then be tested every 3 months for as long as you are taking the medicine. Follow these instructions at home: Alcohol use Do not drink alcohol if your health care provider tells you not to drink. If you drink alcohol: Limit how much you have to 0-2 drinks a day. Know how much alcohol is in your drink. In the [place] one drink equals one 12 oz bottle of beer (355 mL), one 5 oz glass of wine (148 mL), or one 1 oz glass of hard liquor (44 mL). Lifestyle Do not use any products that contain nicotine or tobacco. These products include cigarettes, chewing tobacco, and vaping devices, such as e-cigarettes. If you need help quitting, ask your health care provider. Do not use street drugs. Do not share needles. Ask your health care provider for help if you need support or information about quitting drugs. General instructions Schedule regular health, dental, and eye exams. Stay current with your vaccines. Tell your health care provider if: You often feel depressed. You have ever been abused or do not feel safe at home. Summary Adopting a healthy lifestyle and getting preventive care are important in promoting health and wellness. Follow your health care provider's instructions about healthy diet, exercising, and getting tested or screened for diseases. Follow your health  care provider's instructions on monitoring your cholesterol and blood pressure. This information is not intended to replace advice given to you by your health care provider. Make sure you discuss any questions you have with your health care provider. Document Revised: 05/24/2020 Document Reviewed: 05/24/2020 Elsevier Patient Education  2024 Elsevier Inc. American Heart Association [facility]) Exercise Recommendation  Being physically active is important to prevent heart disease and stroke, the nation's No. 1and No. 5killers. To improve overall cardiovascular health, we suggest at least 150 minutes per week of moderate exercise or 75 minutes per week of vigorous exercise (or a combination of moderate and vigorous activity). Thirty minutes a day, five times a week is an easy goal to remember. You will also experience benefits even if you divide your time into two or three segments of 10 to 15 minutes per day.  For people who would benefit from lowering their blood pressure or cholesterol, we recommend 40 minutes of aerobic exercise of moderate to vigorous intensity three to four times a week to lower the risk for heart attack and stroke.  Physical activity is anything that makes you move  your body and burn calories.  This includes things like climbing stairs or playing sports. Aerobic exercises benefit your heart, and include walking, jogging, swimming or biking. Strength and stretching exercises are best for overall stamina and flexibility.  The simplest, positive change you can make to effectively improve your heart health is to start walking. It's enjoyable, free, easy, social and great exercise. A walking program is flexible and boasts high success rates because people can stick with it. It's easy for walking to become a regular and satisfying part of life.   For Overall Cardiovascular Health: At least 30 minutes of moderate-intensity aerobic activity at least 5 days per week for a total of  150  OR  At least 25 minutes of vigorous aerobic activity at least 3 days per week for a total of 75 minutes; or a combination of moderate- and vigorous-intensity aerobic activity  AND  Moderate- to high-intensity muscle-strengthening activity at least 2 days per week for additional health benefits.  For Lowering Blood Pressure and Cholesterol An average 40 minutes of moderate- to vigorous-intensity aerobic activity 3 or 4 times per week  What if I can't make it to the time goal? Something is always better than nothing! And everyone has to start somewhere. Even if you've been sedentary for years, today is the day you can begin to make healthy changes in your life. If you don't think you'll make it for 30 or 40 minutes, set a reachable goal for today. You can work up toward your overall goal by increasing your time as you get stronger. Don't let all-or-nothing thinking rob you of doing what you can every day.  Source:http://www.heart.org

## 2023-03-01 NOTE — Assessment & Plan Note (Signed)
Normocytic- assess iron, B12 (for which he has a history of), and sed rate for possible anemia of chronic disease.

## 2023-03-01 NOTE — Assessment & Plan Note (Signed)
Currently utilizing alprostadil injections. I suspect that this is likely worsened by psychiatric medications.  Encouraged patient to discuss this with psychiatrist at the [place].

## 2023-03-01 NOTE — Assessment & Plan Note (Signed)
Around 1 week of night sweats. No chronic cough or red flag symptoms. Will order thyroid panel to assess for now. Will order psa to assess prostate. No changes in bowel or bladder habits. Last colonoscopy 1 year ago.

## 2023-03-02 LAB — MICROALBUMIN / CREATININE URINE RATIO
Creatinine, Urine: 148.2 mg/dL
Microalb/Creat Ratio: 12 mg/g{creat} (ref 0–29)
Microalbumin, Urine: 17.9 ug/mL

## 2023-03-09 ENCOUNTER — Other Ambulatory Visit (HOSPITAL_BASED_OUTPATIENT_CLINIC_OR_DEPARTMENT_OTHER): Payer: BC Managed Care – PPO

## 2023-03-13 ENCOUNTER — Other Ambulatory Visit (HOSPITAL_BASED_OUTPATIENT_CLINIC_OR_DEPARTMENT_OTHER): Payer: Self-pay

## 2023-03-13 ENCOUNTER — Other Ambulatory Visit (INDEPENDENT_AMBULATORY_CARE_PROVIDER_SITE_OTHER): Payer: BC Managed Care – PPO

## 2023-03-13 ENCOUNTER — Encounter (HOSPITAL_BASED_OUTPATIENT_CLINIC_OR_DEPARTMENT_OTHER): Payer: Self-pay

## 2023-03-13 DIAGNOSIS — Z125 Encounter for screening for malignant neoplasm of prostate: Secondary | ICD-10-CM

## 2023-03-13 DIAGNOSIS — T63441A Toxic effect of venom of bees, accidental (unintentional), initial encounter: Secondary | ICD-10-CM | POA: Insufficient documentation

## 2023-03-13 DIAGNOSIS — N529 Male erectile dysfunction, unspecified: Secondary | ICD-10-CM | POA: Diagnosis not present

## 2023-03-13 DIAGNOSIS — R61 Generalized hyperhidrosis: Secondary | ICD-10-CM

## 2023-03-13 DIAGNOSIS — D649 Anemia, unspecified: Secondary | ICD-10-CM

## 2023-03-13 NOTE — Addendum Note (Signed)
 Addended by: Shelbie Proctor on: 03/13/2023 08:35 AM   Modules accepted: Orders

## 2023-03-14 LAB — PSA: Prostate Specific Ag, Serum: 0.5 ng/mL (ref 0.0–4.0)

## 2023-03-14 LAB — VITAMIN B12: Vitamin B-12: 346 pg/mL (ref 232–1245)

## 2023-03-14 LAB — IRON,TIBC AND FERRITIN PANEL
Ferritin: 30 ng/mL (ref 30–400)
Iron Saturation: 16 % (ref 15–55)
Iron: 55 ug/dL (ref 38–169)
Total Iron Binding Capacity: 334 ug/dL (ref 250–450)
UIBC: 279 ug/dL (ref 111–343)

## 2023-03-14 LAB — TSH+FREE T4
Free T4: 1.22 ng/dL (ref 0.82–1.77)
TSH: 1.51 u[IU]/mL (ref 0.450–4.500)

## 2023-03-14 LAB — SEDIMENTATION RATE: Sed Rate: 33 mm/h — ABNORMAL HIGH (ref 0–30)

## 2023-03-14 LAB — TESTOSTERONE: Testosterone: 459 ng/dL (ref 264–916)

## 2023-03-15 ENCOUNTER — Encounter (HOSPITAL_BASED_OUTPATIENT_CLINIC_OR_DEPARTMENT_OTHER): Payer: Self-pay | Admitting: Student

## 2023-03-19 ENCOUNTER — Ambulatory Visit (HOSPITAL_BASED_OUTPATIENT_CLINIC_OR_DEPARTMENT_OTHER): Admitting: Student

## 2023-03-19 ENCOUNTER — Encounter (HOSPITAL_BASED_OUTPATIENT_CLINIC_OR_DEPARTMENT_OTHER): Payer: Self-pay | Admitting: Student

## 2023-03-19 VITALS — BP 127/78 | HR 88 | Temp 98.1°F | Ht 68.11 in | Wt 171.7 lb

## 2023-03-19 DIAGNOSIS — J069 Acute upper respiratory infection, unspecified: Secondary | ICD-10-CM

## 2023-03-19 DIAGNOSIS — M109 Gout, unspecified: Secondary | ICD-10-CM | POA: Insufficient documentation

## 2023-03-19 DIAGNOSIS — E119 Type 2 diabetes mellitus without complications: Secondary | ICD-10-CM | POA: Insufficient documentation

## 2023-03-19 DIAGNOSIS — M79673 Pain in unspecified foot: Secondary | ICD-10-CM | POA: Insufficient documentation

## 2023-03-19 LAB — POCT INFLUENZA A/B
Influenza A, POC: NEGATIVE
Influenza B, POC: NEGATIVE

## 2023-03-19 LAB — POC COVID19 BINAXNOW: SARS Coronavirus 2 Ag: NEGATIVE

## 2023-03-19 MED ORDER — OSELTAMIVIR PHOSPHATE 75 MG PO CAPS: 75.0000 mg | ORAL_CAPSULE | Freq: Two times a day (BID) | ORAL | 0 refills | Status: AC

## 2023-03-19 MED ORDER — PROMETHAZINE-DM 6.25-15 MG/5ML PO SYRP: 5.0000 mL | ORAL_SOLUTION | Freq: Four times a day (QID) | ORAL | 0 refills | Status: DC | PRN

## 2023-03-19 NOTE — Progress Notes (Signed)
 Acute Office Visit  Subjective:     Patient ID: Devin Lucero, male    DOB: 07-17-1965, 58 y.o.   MRN: [id number]  Chief Complaint  Patient presents with   Nasal Congestion    Pt. Stats of nasal congestion. Pt. C/o chest burns with a sharp pain in the lower back when coughing. Pt. Stats the congestion has been going on for 2-3 weeks. The coughing had started Saturday morning.     HPI  Viral URI- Patient notes that he has had nasal congestion for around 3 weeks. Has been using zyrtec daily, flonase, and sinus medications. Onset of cough on Saturday. Notes that when he coughs his chest burns and his lower (lumbar) spine has been in pain. Cough is waking him up at night. Two coworkers at his job have the flu. No fever to his knowledge. Has had chills. No sweats. Has had body aches.  Negative for flu and covid per testing today.   ROS Per hpi     Objective:    BP 127/78 (BP Location: Right Arm, Patient Position: Sitting, Cuff Size: Normal)   Pulse 88   Temp 98.1 F (36.7 C) (Oral)   Ht 5' 8.11" (1.73 m)   Wt 171 lb 11.2 oz (77.9 kg)   SpO2 96%   BMI 26.02 kg/m    Physical Exam Constitutional:      General: He is not in acute distress.    Appearance: Normal appearance. He is ill-appearing. He is not toxic-appearing.  HENT:     Head: Normocephalic and atraumatic.     Right Ear: External ear normal.     Left Ear: External ear normal.  Eyes:     General: No scleral icterus.    Conjunctiva/sclera: Conjunctivae normal.  Neck:     Vascular: No carotid bruit.  Cardiovascular:     Rate and Rhythm: Normal rate and regular rhythm.     Pulses: Normal pulses.     Heart sounds: Normal heart sounds. No murmur heard.    No friction rub.  Pulmonary:     Effort: Pulmonary effort is normal. No respiratory distress.     Breath sounds: Normal breath sounds. No wheezing, rhonchi or rales.  Musculoskeletal:        General: Normal range of motion.     Cervical back: Neck supple.      Right lower leg: No edema.     Left lower leg: No edema.  Skin:    General: Skin is warm and dry.     Coloration: Skin is not jaundiced or pale.  Neurological:     General: No focal deficit present.     Mental Status: He is alert.  Psychiatric:        Mood and Affect: Mood normal.        Behavior: Behavior normal.     Results for orders placed or performed in visit on 03/19/23  POCT Influenza A/B  Result Value Ref Range   Influenza A, POC Negative Negative   Influenza B, POC Negative Negative  POC COVID-19 BinaxNow  Result Value Ref Range   SARS Coronavirus 2 Ag Negative Negative        Assessment & Plan:   Viral URI - suspect likely flu due to contact with coworker - take tamiflu bid for 5 days - counseled on symptomatic treatment  I have spent greater than 20 minutes charting, educating, diagnosing and managing this patient for this visit.   Return if symptoms worsen or  fail to improve.  Teryl Lucy Christianna Belmonte, PA-C

## 2023-03-19 NOTE — Patient Instructions (Addendum)
 It was nice to see you today!  Be sure to hydrate and drink plenty of fluids like water, tea, broth.  It does not currently sound bacterial, so at this point antibiotics would likely not be helpful for you.  We want to make sure we are not getting rid of the good bacteria in your body, and the best way to do that is to abstain from antibiotics unless it is necessary.   At this point you can use DayQuil during the day to help or NyQuil at night to help you get a sleep.  If your main problem is that you are congested, you may try over-the-counter Sudafed-this should not be used for greater than 5 days in a row.  Your other option would be to continue Mucinex or Mucinex DM if you have a cough.  Any of these medications should help you, but please make sure to read the label to make sure you are not doubling up on any active ingredients listed in them.   Do not mix DM products with the promethazine dm syrup.   I would also encourage you to try an over-the-counter nasal saline spray as this can help with congestion.  If you have any problems before your next visit feel free to message me via MyChart (minor issues or questions) or call the office, otherwise you may reach out to schedule an office visit.  Thank you! Gerilyn Pilgrim Dalen [place], PA-C

## 2023-04-02 ENCOUNTER — Ambulatory Visit (HOSPITAL_BASED_OUTPATIENT_CLINIC_OR_DEPARTMENT_OTHER): Admitting: Student

## 2023-04-12 ENCOUNTER — Ambulatory Visit (HOSPITAL_BASED_OUTPATIENT_CLINIC_OR_DEPARTMENT_OTHER): Admitting: Student

## 2023-04-12 ENCOUNTER — Encounter (HOSPITAL_BASED_OUTPATIENT_CLINIC_OR_DEPARTMENT_OTHER): Payer: Self-pay | Admitting: Student

## 2023-04-12 VITALS — BP 127/74 | HR 81 | Temp 98.4°F | Resp 16 | Ht 68.0 in | Wt 177.2 lb

## 2023-04-12 DIAGNOSIS — E1140 Type 2 diabetes mellitus with diabetic neuropathy, unspecified: Secondary | ICD-10-CM

## 2023-04-12 DIAGNOSIS — M7702 Medial epicondylitis, left elbow: Secondary | ICD-10-CM

## 2023-04-12 MED ORDER — MELOXICAM 7.5 MG PO TABS: 7.5000 mg | ORAL_TABLET | Freq: Every day | ORAL | 0 refills | Status: DC

## 2023-04-12 NOTE — Patient Instructions (Addendum)
 It was nice to see you today!  As we discussed in clinic I will give you exercises for your arm. You can try an arm brace for "medial epicondylitis"- this should pull it up on amaz[employer].  If you have any problems before your next visit feel free to message me via MyChart (minor issues or questions) or call the office, otherwise you may reach out to schedule an office visit.  Thank you! Gerilyn Pilgrim Midas Daughety, PA-C
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
## 2023-04-20 DIAGNOSIS — Z23 Encounter for immunization: Secondary | ICD-10-CM | POA: Diagnosis not present

## 2023-04-20 DIAGNOSIS — E1140 Type 2 diabetes mellitus with diabetic neuropathy, unspecified: Secondary | ICD-10-CM | POA: Diagnosis not present

## 2023-04-20 DIAGNOSIS — R413 Other amnesia: Secondary | ICD-10-CM | POA: Diagnosis not present

## 2023-04-20 DIAGNOSIS — N529 Male erectile dysfunction, unspecified: Secondary | ICD-10-CM | POA: Diagnosis not present

## 2023-04-20 DIAGNOSIS — E119 Type 2 diabetes mellitus without complications: Secondary | ICD-10-CM | POA: Diagnosis not present

## 2023-05-23 DIAGNOSIS — R351 Nocturia: Secondary | ICD-10-CM | POA: Diagnosis not present

## 2023-05-23 DIAGNOSIS — R3912 Poor urinary stream: Secondary | ICD-10-CM | POA: Diagnosis not present

## 2023-05-23 DIAGNOSIS — R39198 Other difficulties with micturition: Secondary | ICD-10-CM | POA: Diagnosis not present

## 2023-05-23 DIAGNOSIS — N529 Male erectile dysfunction, unspecified: Secondary | ICD-10-CM | POA: Diagnosis not present

## 2023-05-23 DIAGNOSIS — F5221 Male erectile disorder: Secondary | ICD-10-CM | POA: Diagnosis not present

## 2023-05-29 ENCOUNTER — Ambulatory Visit (HOSPITAL_BASED_OUTPATIENT_CLINIC_OR_DEPARTMENT_OTHER): Payer: BC Managed Care – PPO | Admitting: Student

## 2023-06-21 DIAGNOSIS — N281 Cyst of kidney, acquired: Secondary | ICD-10-CM | POA: Diagnosis not present

## 2023-06-21 DIAGNOSIS — Z7985 Long-term (current) use of injectable non-insulin antidiabetic drugs: Secondary | ICD-10-CM | POA: Diagnosis not present

## 2023-06-21 DIAGNOSIS — N200 Calculus of kidney: Secondary | ICD-10-CM | POA: Diagnosis not present

## 2023-06-21 DIAGNOSIS — Z7984 Long term (current) use of oral hypoglycemic drugs: Secondary | ICD-10-CM | POA: Diagnosis not present

## 2023-06-21 DIAGNOSIS — E119 Type 2 diabetes mellitus without complications: Secondary | ICD-10-CM | POA: Diagnosis not present

## 2023-06-21 DIAGNOSIS — K5730 Diverticulosis of large intestine without perforation or abscess without bleeding: Secondary | ICD-10-CM | POA: Diagnosis not present

## 2023-07-09 ENCOUNTER — Ambulatory Visit (HOSPITAL_BASED_OUTPATIENT_CLINIC_OR_DEPARTMENT_OTHER): Admitting: Student

## 2023-07-09 ENCOUNTER — Encounter (HOSPITAL_BASED_OUTPATIENT_CLINIC_OR_DEPARTMENT_OTHER): Payer: Self-pay | Admitting: Student

## 2023-07-09 ENCOUNTER — Ambulatory Visit (HOSPITAL_BASED_OUTPATIENT_CLINIC_OR_DEPARTMENT_OTHER)
Admission: RE | Admit: 2023-07-09 | Discharge: 2023-07-09 | Disposition: A | Discharge status: Home / Self Care | Source: Ambulatory Visit | Attending: Student | Admitting: Radiology

## 2023-07-09 VITALS — BP 128/78 | HR 75 | Temp 98.3°F | Resp 16 | Ht 68.0 in | Wt 185.7 lb

## 2023-07-09 DIAGNOSIS — M25531 Pain in right wrist: Secondary | ICD-10-CM

## 2023-07-09 DIAGNOSIS — M25539 Pain in unspecified wrist: Secondary | ICD-10-CM

## 2023-07-09 DIAGNOSIS — M79631 Pain in right forearm: Secondary | ICD-10-CM | POA: Diagnosis not present

## 2023-07-09 NOTE — Progress Notes (Unsigned)
 Acute Office Visit  Subjective:     Patient ID: Devin Lucero, male    DOB: Jul 23, 1965, 58 y.o.   MRN: [id number]  Chief Complaint  Patient presents with   Cyst    [place] Saturday walking with dog. Tripped. Hit right wrist on a bush. Has a knot. Did not hurt anything else.     HPI  Discussed the use of AI scribe software for clinical note transcription with the patient, who gave verbal consent to proceed.  History of Present Illness   Devin Lucero is a 58 year old male who presents with right wrist pain following a fall.  He experienced right ulnar sided wrist pain after falling on Saturday when his dog got tangled in a leash, causing him to fall into a shrub. The pain is located on the ulnar side of the wrist, with a sensitive knot that hurts when pressure is applied or when bending the wrist- this area is about a quarter of the way up his arm. The pain radiates up to the mid forearm during certain movements.  He landed on an outstretched arm, specifically on the palm, which is where the pain is concentrated. No elbow pain, numbness, tingling, or burning sensations, except for a burning sensation at the wrist. He can bend his hands up and out, although it causes pain to radiate up the forearm.  The pain has been present for two to three days since the fall on Saturday evening. The pain has remained stable but intensifies with certain activities. He is currently taking Tylenol for pain management.  No pain in the fingers or hand. He did not hit his head during the fall.  Socially, he mentions that his dog, a seven-month-old Rottweiler, has separation anxiety, which contributed to the incident. He has been back to the [place] since his last visit to assist with benefits.     ROS Per HPI     Objective:    BP 128/78   Pulse 75   Temp 98.3 F (36.8 C) (Oral)   Resp 16   Ht 5' 8 (1.727 m)   Wt 185 lb 11.2 oz (84.2 kg)   SpO2 95%   BMI 28.24 kg/m    Physical  Exam Constitutional:      General: He is not in acute distress.    Appearance: Normal appearance. He is not ill-appearing.  HENT:     Head: Normocephalic and atraumatic.     Right Ear: External ear normal.     Left Ear: External ear normal.     Nose: Nose normal.   Eyes:     Conjunctiva/sclera: Conjunctivae normal.    Cardiovascular:     Rate and Rhythm: Normal rate and regular rhythm.     Pulses: Normal pulses.     Heart sounds: Normal heart sounds. No murmur heard.    No friction rub.  Pulmonary:     Effort: Pulmonary effort is normal. No respiratory distress.     Breath sounds: Normal breath sounds. No wheezing, rhonchi or rales.   Musculoskeletal:     Comments: R Hand/Wrist- Full aROM. Ulnar sided tenderness without wrist swelling, skin tinting, or loss of blood flow to the extremity. Pain is in proportion to the exam. No major hematoma formation. Small raised area tender to palpation on dorsal forearm, possible minor hematoma.   Skin:    General: Skin is warm and dry.     Coloration: Skin is not jaundiced or pale.   Neurological:  Mental Status: He is alert.   Psychiatric:        Mood and Affect: Mood normal.        Behavior: Behavior normal.     No results found for any visits on 07/09/23.      Assessment & Plan:   Assessment and Plan    Right wrist pain Acute right wrist pain following a fall on an outstretched arm into a shrub. Pain is localized to the ulnar side of the wrist with tenderness and burning sensation. No numbness, tingling, or elbow pain. Differential diagnosis includes wrist fracture or soft tissue injury. X-ray is needed to rule out fracture. Pain exacerbates with certain movements. Main concern is to ensure no fracture; if present, referral to orthopedics is necessary. An ulnar gutter splint is recommended for immobilization and healing. Tylenol is used for pain management. Avoidance of excessive wrist use is advised, especially considering  work activities. - Order right wrist x-ray - Advise use of an ulnar gutter splint for immobilization - Utilize over-the-counter Tylenol for pain management - Advise to avoid excessive wrist use - Schedule follow-up in six weeks - Instruct to return if symptoms worsen      Return in about 6 weeks (around 08/20/2023).  Zurie Platas T Jesslyn Viglione, PA-C

## 2023-07-09 NOTE — Patient Instructions (Addendum)
 It was nice to see you today!  As we discussed in clinic:  I would encourage you to buy an ulnar gutter splint.    If you have any problems before your next visit feel free to message me via MyChart (minor issues or questions) or call the office, otherwise you may reach out to schedule an office visit.  Thank you! Avleen Bordwell, PA-C

## 2023-07-16 ENCOUNTER — Ambulatory Visit (HOSPITAL_BASED_OUTPATIENT_CLINIC_OR_DEPARTMENT_OTHER): Payer: Self-pay | Admitting: Student

## 2023-07-31 ENCOUNTER — Ambulatory Visit (HOSPITAL_BASED_OUTPATIENT_CLINIC_OR_DEPARTMENT_OTHER): Admitting: Family Medicine

## 2023-07-31 ENCOUNTER — Ambulatory Visit: Payer: Self-pay

## 2023-07-31 VITALS — BP 122/72 | Temp 97.7°F | Resp 16 | Ht 68.5 in | Wt 189.3 lb

## 2023-07-31 DIAGNOSIS — M79661 Pain in right lower leg: Secondary | ICD-10-CM | POA: Insufficient documentation

## 2023-07-31 DIAGNOSIS — R1033 Periumbilical pain: Secondary | ICD-10-CM

## 2023-07-31 DIAGNOSIS — R109 Unspecified abdominal pain: Secondary | ICD-10-CM | POA: Insufficient documentation

## 2023-07-31 DIAGNOSIS — Z7729 Contact with and (suspected ) exposure to other hazardous substances: Secondary | ICD-10-CM | POA: Insufficient documentation

## 2023-07-31 NOTE — Assessment & Plan Note (Signed)
 Mild.  Reassured.  Suspected soft tissue strain.  Avoid mod-heavy lifting for the coming week.  Offered labs today as a precaution which he declines.  Moist heat.  Obviously alert us  if not better in the coming week.

## 2023-07-31 NOTE — Telephone Encounter (Signed)
 FYI Only or Action Required?: FYI only for provider.  Patient was last seen in primary care on 10/25/2021 by Severa Rock HERO, FNP.  Called Nurse Triage reporting Abdominal Pain.  Symptoms began 4 days ago.  Interventions attempted: Rest, hydration, or home remedies.  Symptoms are: unchanged.  Triage Disposition: See HCP Within 4 Hours (Or PCP Triage)  Patient/caregiver understands and will follow disposition?: Yes  Copied from CRM [id number]. Topic: Clinical - Red Word Triage >> Jul 31, 2023  8:41 AM Powell HERO wrote: Red Word that prompted transfer to Nurse Triage:  Patient states he lifted something heavy and now he has a hard knot on the right center abdomen. Feeling severe pain constantly. Reason for Disposition  [1] MILD-MODERATE pain AND [2] constant AND [3] present > 2 hours  Answer Assessment - Initial Assessment Questions 1. LOCATION: Where does it hurt?      Patient reports pain to the right center of his abdomen.  2. RADIATION: Does the pain shoot anywhere else? (e.g., chest, back)     No radiation 3. ONSET: When did the pain begin? (Minutes, hours or days ago)      Pain has been going on since Saturday afternoon 4. SUDDEN: Gradual or sudden onset?     sudden 5. PATTERN Does the pain come and go, or is it constant?     constant 6. SEVERITY: How bad is the pain?  (e.g., Scale 1-10; mild, moderate, or severe)     7 out of 10 7. RECURRENT SYMPTOM: Have you ever had this type of stomach pain before? If Yes, ask: When was the last time? and What happened that time?      Yes-last time patient had a hernia 8. CAUSE: What do you think is causing the stomach pain? (e.g., gallstones, recent abdominal surgery)     Patient is concerned for hernia 9. RELIEVING/AGGRAVATING FACTORS: What makes it better or worse? (e.g., antacids, bending or twisting motion, bowel movement)     Patient reports any movement against the area or tighten stomach muscles causes  discomfort 10. OTHER SYMPTOMS: Do you have any other symptoms? (e.g., back pain, diarrhea, fever, urination pain, vomiting)       no  Protocols used: Abdominal Pain - Male-A-AH

## 2023-07-31 NOTE — Progress Notes (Signed)
 Established Patient Office Visit  Subjective   Patient ID: Devin Lucero, male    DOB: 1965/02/12  Age: 58 y.o. MRN: [id number]  Chief Complaint  Patient presents with   Rt side pain    Rt side pain   Establish Care    F/u as above.  He reports about 3 days of intermittent periumbilical pain.  No fever.  Not triggered by a meal.  He does somewhat physical labor on occasion and admits his pain may be related to pulled muscles.  Bowel movements are fine.  No urinary problems.  Minimal nausea.    Past Medical History:  Diagnosis Date   Allergy    Anxiety    Blepharitis 01/15/2023   Depression    Diabetes mellitus without complication (HCC)    Dizziness 01/15/2023   GERD (gastroesophageal reflux disease)    Heart attack [facility])    Patient reports mild heart attack around 2018/2019. Stayed at [facility] for a few days. No intervention.   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   buPROPion (WELLBUTRIN XL) 150 MG 24 hr tablet Take 150 mg by mouth daily.   Capsaicin 0.1 % CREA Apply topically.   cetirizine (ZYRTEC) 10 MG tablet Take 10 mg by mouth daily.   Cholecalciferol 25 MCG (1000 UT) tablet Take 1,000 Units by mouth daily.   Continuous Glucose Sensor (FREESTYLE LIBRE 2 SENSOR) MISC 1 applicator every 14 (fourteen) days. APPLY 1 APPLICATOR ON BACK OF ARM EVERY 14 DAYS   cyanocobalamin  (,VITAMIN B-12,) 1000 MCG/ML injection Inject 1,000 mcg into  the muscle every 30 (thirty) days.   empagliflozin (JARDIANCE) 25 MG TABS tablet Take 25 mg by mouth daily. VA INCREASED RX   EPINEPHrine 0.3 mg/0.3 mL IJ SOAJ injection Inject 0.3 mg into the muscle as needed for anaphylaxis.   escitalopram (LEXAPRO) 20 MG tablet Take 20 mg by mouth daily.   fluticasone (FLONASE) 50 MCG/ACT nasal spray Place 2 sprays into both nostrils daily.   Hypertonic Nasal Wash (SINUS RINSE BOTTLE KIT NA) Place 1 packet into the nose. Use 1 packet to rinse each nostril as needed for sinus use   levETIRAcetam  (KEPPRA ) 750 MG tablet Take 750 mg by mouth 3 (three) times daily.   Melatonin 3 MG CAPS Take 3 mg by mouth. Take 1-2 capsules as needed   meloxicam  (MOBIC ) 7.5 MG tablet Take 1 tablet (7.5 mg total) by mouth daily.   omeprazole  (PRILOSEC) 40 MG capsule Take 40 mg by mouth as needed.   ondansetron  (ZOFRAN -ODT) 4 MG disintegrating tablet Take 1 tablet (4 mg total) by mouth every 8 (eight) hours as needed for nausea or vomiting.   prazosin (MINIPRESS) 5 MG capsule Take 5 mg by mouth at bedtime.   QUEtiapine (SEROQUEL) 100 MG tablet Take 100 mg by mouth. Take 1/2 tablet by mouth at bedtime for mental health, mood   rizatriptan (MAXALT-MLT) 10 MG disintegrating tablet Take  10 mg by mouth as needed for migraine. May repeat in 2 hours if needed   rosuvastatin (CRESTOR) 40 MG tablet Take 40 mg by mouth daily. DISCONTINUE ATORVASTATIN  INCREASED DOSE   Semaglutide, 2 MG/DOSE, 8 MG/3ML SOPN Inject 2 mg into the skin once a week.   sertraline (ZOLOFT) 100 MG tablet Take 200 mg by mouth daily.   Syringe, Disposable, (2-3CC SYRINGE) 3 ML MISC 1 Syringe by Does not apply route as directed. USE 1 SYRINGE INTRAMUSCULARLY AS DIRECTED BY YOUR PROVIDER (B-12 INJECTION)   tamsulosin (FLOMAX) 0.4 MG CAPS capsule Take 0.4 mg by mouth daily. STOP IF YOUR BP IS LESS THAN 100/60 OR YOU HAVE SEVERE LONG LASTING DIZZINESS OR ALLERGIC SYMPTOMS. TAKE 30 MINUTES AFTER BREAKFAST OR LUNCH.   vardenafil  (LEVITRA) 20 MG tablet Take 20 mg by mouth daily as needed.   No facility-administered encounter medications on file as of 07/31/2023.    Social History   Tobacco Use   Smoking status: Never    Passive exposure: Never  Vaping Use   Vaping status: Never Used  Substance Use Topics   Alcohol use: No   Drug use: Not Currently      Review of Systems  Constitutional:  Negative for diaphoresis, fever, malaise/fatigue and weight loss.  Respiratory:  Negative for cough, shortness of breath and wheezing.   Cardiovascular:  Negative for chest pain, palpitations, orthopnea, claudication, leg swelling and PND.  Gastrointestinal:  Positive for abdominal pain. Negative for blood in stool, constipation, diarrhea, heartburn and vomiting.  Genitourinary:  Negative for dysuria, flank pain and hematuria.      Objective:     BP 122/72 (BP Location: Right Arm, Cuff Size: Normal)   Temp 97.7 F (36.5 C) (Oral)   Resp 16   Ht 5' 8.5 (1.74 m)   Wt 189 lb 4.8 oz (85.9 kg)   BMI 28.36 kg/m    Physical Exam Constitutional:      General: He is not in acute distress.    Appearance: Normal appearance.     Comments: Comfortable  HENT:     Head: Normocephalic.  Neck:     Vascular: No carotid bruit.  Cardiovascular:     Rate and Rhythm: Normal rate and regular rhythm.     Pulses: Normal pulses.     Heart sounds: Normal heart sounds.  Pulmonary:     Effort: Pulmonary effort is normal.     Breath sounds: Normal breath sounds.  Abdominal:     General: Bowel sounds are normal. There is no distension.     Palpations: Abdomen is soft. There is no mass.     Tenderness: There is no guarding.     Hernia: No hernia is present.     Comments: Periumbilical scar noted.  Only mild right periumbilical discomfort noted.  No recurring hernia appreciated.  Musculoskeletal:     Cervical back: Neck supple. No tenderness.     Right lower leg: No edema.     Left lower leg: No edema.  Neurological:      Mental Status: He is alert.      No results found for any visits on 07/31/23.    The ASCVD Risk score (Arnett DK, et al., 2019) failed to calculate for the following reasons:   Risk score cannot be calculated because patient has a medical history suggesting prior/existing ASCVD    Assessment & Plan:  Periumbilical abdominal pain Assessment & Plan: Mild.  Reassured.  Suspected soft tissue strain.  Avoid mod-heavy lifting for the coming week.  Offered labs today as a precaution which he declines.  Moist heat.  Obviously alert us  if not better in the coming week.     Return if symptoms worsen or fail to improve.    REDDING PONCE NORLEEN FALCON., MD

## 2023-08-01 ENCOUNTER — Ambulatory Visit (HOSPITAL_BASED_OUTPATIENT_CLINIC_OR_DEPARTMENT_OTHER): Admitting: Student

## 2023-08-01 LAB — HM DIABETES EYE EXAM

## 2023-08-02 ENCOUNTER — Ambulatory Visit (HOSPITAL_BASED_OUTPATIENT_CLINIC_OR_DEPARTMENT_OTHER): Admitting: Family Medicine

## 2023-08-02 ENCOUNTER — Encounter (HOSPITAL_BASED_OUTPATIENT_CLINIC_OR_DEPARTMENT_OTHER): Payer: Self-pay | Admitting: Family Medicine

## 2023-08-02 ENCOUNTER — Ambulatory Visit (HOSPITAL_BASED_OUTPATIENT_CLINIC_OR_DEPARTMENT_OTHER): Payer: Self-pay | Admitting: Family Medicine

## 2023-08-02 ENCOUNTER — Ambulatory Visit (HOSPITAL_BASED_OUTPATIENT_CLINIC_OR_DEPARTMENT_OTHER): Admitting: Student

## 2023-08-02 ENCOUNTER — Ambulatory Visit (HOSPITAL_BASED_OUTPATIENT_CLINIC_OR_DEPARTMENT_OTHER)
Admission: RE | Admit: 2023-08-02 | Discharge: 2023-08-02 | Disposition: A | Discharge status: Home / Self Care | Source: Ambulatory Visit | Attending: Family Medicine | Admitting: Family Medicine

## 2023-08-02 VITALS — BP 107/70 | HR 79 | Temp 98.5°F | Resp 17 | Ht 68.5 in | Wt 184.6 lb

## 2023-08-02 DIAGNOSIS — M79671 Pain in right foot: Secondary | ICD-10-CM | POA: Diagnosis not present

## 2023-08-02 DIAGNOSIS — R1033 Periumbilical pain: Secondary | ICD-10-CM | POA: Diagnosis not present

## 2023-08-02 DIAGNOSIS — E1165 Type 2 diabetes mellitus with hyperglycemia: Secondary | ICD-10-CM | POA: Insufficient documentation

## 2023-08-02 DIAGNOSIS — Z7689 Persons encountering health services in other specified circumstances: Secondary | ICD-10-CM | POA: Insufficient documentation

## 2023-08-02 DIAGNOSIS — M19071 Primary osteoarthritis, right ankle and foot: Secondary | ICD-10-CM | POA: Diagnosis not present

## 2023-08-02 DIAGNOSIS — E1142 Type 2 diabetes mellitus with diabetic polyneuropathy: Secondary | ICD-10-CM | POA: Insufficient documentation

## 2023-08-02 DIAGNOSIS — M7989 Other specified soft tissue disorders: Secondary | ICD-10-CM | POA: Diagnosis not present

## 2023-08-02 DIAGNOSIS — K279 Peptic ulcer, site unspecified, unspecified as acute or chronic, without hemorrhage or perforation: Secondary | ICD-10-CM | POA: Insufficient documentation

## 2023-08-02 NOTE — Progress Notes (Signed)
 Established Patient Office Visit  Subjective   Patient ID: Devin Lucero, male    DOB: 1965/05/26  Age: 58 y.o. MRN: [id number]  Chief Complaint  Patient presents with   Rt. foot pain    Rt. Foot pain    He states he injured his right foot last night with moderate residual pain.  His recently noted abdominal pain is improved.    Past Medical History:  Diagnosis Date   Allergy    Anxiety    Blepharitis 01/15/2023   Depression    Diabetes mellitus without complication (HCC)    Dizziness 01/15/2023   GERD (gastroesophageal reflux disease)    Heart attack [facility])    Patient reports mild heart attack around 2018/2019. Stayed at [facility] for a few days. No intervention.   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   buPROPion (WELLBUTRIN XL) 150 MG 24 hr tablet Take 150 mg by mouth daily.   Capsaicin 0.1 % CREA Apply topically.   cetirizine (ZYRTEC) 10 MG tablet Take 10 mg by mouth daily.   Cholecalciferol 25 MCG (1000 UT) tablet Take 1,000 Units by mouth daily.   Continuous Glucose Sensor (FREESTYLE LIBRE 2 SENSOR) MISC 1 applicator every 14 (fourteen) days. APPLY 1 APPLICATOR ON BACK OF ARM EVERY 14 DAYS   cyanocobalamin  (,VITAMIN B-12,) 1000 MCG/ML injection Inject 1,000 mcg into the muscle every 30 (thirty) days.   empagliflozin (JARDIANCE) 25 MG TABS tablet Take 25 mg by mouth daily. [facility] INCREASED RX   EPINEPHrine 0.3 mg/0.3 mL IJ SOAJ  injection Inject 0.3 mg into the muscle as needed for anaphylaxis.   escitalopram (LEXAPRO) 20 MG tablet Take 20 mg by mouth daily.   fluticasone (FLONASE) 50 MCG/ACT nasal spray Place 2 sprays into both nostrils daily.   Hypertonic Nasal Wash (SINUS RINSE BOTTLE KIT NA) Place 1 packet into the nose. Use 1 packet to rinse each nostril as needed for sinus use   levETIRAcetam  (KEPPRA ) 750 MG tablet Take 750 mg by mouth 3 (three) times daily.   Melatonin 3 MG CAPS Take 3 mg by mouth. Take 1-2 capsules as needed   meloxicam  (MOBIC ) 7.5 MG tablet Take 1 tablet (7.5 mg total) by mouth daily.   omeprazole  (PRILOSEC) 40 MG capsule Take 40 mg by mouth as needed.   ondansetron  (ZOFRAN -ODT) 4 MG disintegrating tablet Take 1 tablet (4 mg total) by mouth every 8 (eight) hours as needed for nausea or vomiting.   prazosin (MINIPRESS) 5 MG capsule Take 5 mg by mouth at bedtime.   QUEtiapine (SEROQUEL) 100 MG tablet Take 100 mg by mouth. Take 1/2 tablet by mouth at bedtime for mental health, mood   rizatriptan (MAXALT-MLT) 10 MG disintegrating tablet Take 10 mg by mouth as needed for migraine. May repeat in 2 hours if needed   rosuvastatin (CRESTOR) 40 MG tablet Take 40 mg by mouth daily. DISCONTINUE ATORVASTATIN  INCREASED DOSE   Semaglutide,  2 MG/DOSE, 8 MG/3ML SOPN Inject 2 mg into the skin once a week.   sertraline (ZOLOFT) 100 MG tablet Take 200 mg by mouth daily.   Syringe, Disposable, (2-3CC SYRINGE) 3 ML MISC 1 Syringe by Does not apply route as directed. USE 1 SYRINGE INTRAMUSCULARLY AS DIRECTED BY YOUR PROVIDER (B-12 INJECTION)   tamsulosin (FLOMAX) 0.4 MG CAPS capsule Take 0.4 mg by mouth daily. STOP IF YOUR BP IS LESS THAN 100/60 OR YOU HAVE SEVERE LONG LASTING DIZZINESS OR ALLERGIC SYMPTOMS. TAKE 30 MINUTES AFTER BREAKFAST OR LUNCH.   vardenafil (LEVITRA) 20 MG tablet Take 20 mg by mouth daily as needed.   No facility-administered encounter medications on file as of 08/02/2023.    Social History    Tobacco Use   Smoking status: Never    Passive exposure: Never  Vaping Use   Vaping status: Never Used  Substance Use Topics   Alcohol use: No   Drug use: Not Currently      ROS    Objective:     BP 107/70 (BP Location: Right Arm, Patient Position: Standing, Cuff Size: Normal)   Pulse 79   Temp 98.5 F (36.9 C) (Oral)   Resp 17   Ht 5' 8.5 (1.74 m)   Wt 184 lb 9.6 oz (83.7 kg)   SpO2 95%   BMI 27.66 kg/m    Physical Exam Constitutional:      General: He is not in acute distress.    Appearance: Normal appearance.  HENT:     Head: Normocephalic.  Neck:     Vascular: No carotid bruit.  Cardiovascular:     Rate and Rhythm: Normal rate and regular rhythm.     Pulses: Normal pulses.     Heart sounds: Normal heart sounds.  Pulmonary:     Effort: Pulmonary effort is normal.     Breath sounds: Normal breath sounds.  Abdominal:     General: Bowel sounds are normal.     Palpations: Abdomen is soft.  Musculoskeletal:     Cervical back: Neck supple. No tenderness.     Right lower leg: No edema.     Left lower leg: No edema.     Comments: Right 4th toe with mod. Discomfort and edema.  Contusion noted with tiny intact blister.  Mild to mod tenderness along distal Metatarsal.  Ankle is nontender.  Neurological:     Mental Status: He is alert.      No results found for any visits on 08/02/23.    The ASCVD Risk score (Arnett DK, et al., 2019) failed to calculate for the following reasons:   Risk score cannot be calculated because patient has a medical history suggesting prior/existing ASCVD    Assessment & Plan:  Foot pain, right Assessment & Plan: Later confirmed as a contusion.  Rest, ice, elevation.  Buddy taped.  Hygiene for the toe discussed.  Meloxicam  with meals prn.  Comfortable shoes.  F/u with Jacob prn.  Orders: -     DG Foot Complete Right; Future  Periumbilical abdominal pain    No follow-ups on file.    REDDING PONCE NORLEEN FALCON., MD

## 2023-08-02 NOTE — Assessment & Plan Note (Addendum)
 Later confirmed as a contusion.  Rest, ice, elevation.  Buddy taped.  Hygiene for the toe discussed.  Meloxicam  with meals prn.  Comfortable shoes.  F/u with Jacob prn.

## 2023-08-15 ENCOUNTER — Ambulatory Visit: Payer: Self-pay

## 2023-08-15 NOTE — Telephone Encounter (Signed)
 FYI Only or Action Required?: Action required by provider: update on patient condition and Patient advised to go to Urgent Care today to have area evaluated.  Patient was last seen in primary care on 08/02/2023 by Devin Norleen PHEBE PONCE, MD.  Called Nurse Triage reporting Laceration.  Symptoms began just prior to triage with this RN.  Interventions attempted: Rest, hydration, or home remedies and Other: wet washcloth over area.  Symptoms are: unchanged.  Triage Disposition: See Physician Within 24 Hours  Patient/caregiver understands and will follow disposition?: Yes--Patient advised to go to Urgent Care to be evaluated today                                    Copied from CRM #[id number]. Topic: Clinical - Red Word Triage >> Aug 15, 2023  1:34 PM Drema MATSU wrote: Red Word that prompted transfer to Nurse Triage: Patient was doing some work to an old house. He was in a crawl space undeath house and caught the edge of the doorspace,  hitting his head. He states that some skin off his head is missing (flap of skin)and it is bleeding.(Has a towel on it right now)    ----------------------------------------------------------------------- From previous Reason for Contact - Scheduling: Patient/patient representative is calling to schedule an appointment. Refer to attachments for appointment information. Reason for Disposition  [1] No prior tetanus shots (or is not fully vaccinated) AND [2] any wound (e.g., cut, scrape)  Answer Assessment - Initial Assessment Questions Patient cannot see the lac but his wife suggested that he goes to get it evaluated Bleeding seems to have stopped per patient who is holding a wet washcloth to the area He states a flap of skin came off when he hit his head Patient states his wife said a flap of skin is loose in this area Patient is alert and denies loss of consciousness or any other symptoms other than pain in this area  Patient  states he is allergic to penicillin and tetanus   Patient is advised that it is recommended to have this area evaluated today due to the location, area in which this injury occurred (under an old house), area may need cleaning, and for a provider to evaluate the area the skin flap is at. No openings at PCP office today Patient was advised that Urgent Care is available in the same building as his PCP Patient also advised that if anything gets worse to go to the Emergency Room Patient verbalized understanding.           1. MECHANISM: How did the injury happen? For falls, ask: What height did you fall from? and What surface did you fall against?      Under crawl space under an old house 2. ONSET: When did the injury happen? (e.g., minutes, hours ago)      Just prior to triage 3. NEUROLOGIC SYMPTOMS: Was there any loss of consciousness? Are there any other neurological symptoms?      no 4. MENTAL STATUS: Does the person know who they are, who you are, and where they are?      yes 5. LOCATION: What part of the head was hit?      Top of head 6. SCALP APPEARANCE: What does the scalp look like? Is it bleeding now? If Yes, ask: Is it difficult to stop?      ---- 7. SIZE: For cuts, bruises, or swelling,  ask: How large is it? (e.g., inches or centimeters)      ---- 8. PAIN: Is there any pain? If Yes, ask: How bad is it? (Scale 0-10; or none, mild, moderate, severe)     5 9. TETANUS: For any breaks in the skin, ask: When was your last tetanus booster?     N/A 10. BLOOD THINNERS: Do you take any blood thinners? (e.g., aspirin, clopidogrel / Plavix, coumadin, heparin). Notes: Other strong blood thinners include: Arixtra (fondaparinux), Eliquis (apixaban), Pradaxa (dabigatran), and Xarelto (rivaroxaban).       No 11. OTHER SYMPTOMS: Do you have any other symptoms? (e.g., neck pain, vomiting)       No  Protocols used: Head Injury-A-AH

## 2023-08-16 ENCOUNTER — Ambulatory Visit (HOSPITAL_BASED_OUTPATIENT_CLINIC_OR_DEPARTMENT_OTHER): Admitting: Student

## 2023-08-16 ENCOUNTER — Encounter (HOSPITAL_BASED_OUTPATIENT_CLINIC_OR_DEPARTMENT_OTHER): Payer: Self-pay | Admitting: Student

## 2023-08-16 VITALS — BP 128/78 | HR 79 | Temp 97.9°F | Resp 16 | Ht 68.5 in | Wt 188.0 lb

## 2023-08-16 DIAGNOSIS — S0101XA Laceration without foreign body of scalp, initial encounter: Secondary | ICD-10-CM

## 2023-08-16 NOTE — Progress Notes (Signed)
 Acute Office Visit  Subjective:     Patient ID: Devin Lucero, male    DOB: [birth year], 58 y.o.   MRN: [id number]  Chief Complaint  Patient presents with   Head Injury    Hit head on scrap going into little door. Has headache since. Has been taking ibuprofen 800mg  and OTC sudafed (it said pain relief). Got headache 30-45 minutes.     HPI  Discussed the use of AI scribe software for clinical note transcription with the patient, who gave verbal consent to proceed.  History of Present Illness   Devin Lucero is a 58 year old male who presents with a scalp wound after a fall. His wife is present during the visit.  He sustained a scalp wound while working on electrical lights under the steps at home. He fell and caught the edge of some old paneling and a two by four, resulting in a peeled back area of skin. The incident occurred yesterday, and he has experienced a headache since then. No fever or other systemic symptoms. He did not seek urgent care after the injury and has not used any ointment on the wound.  His wife removed a piece of skin with tweezers last night, and he has been keeping a wet rag on the wound until she sprayed it with peroxide three or four times. He is allergic to tetanus and penicillin, which complicates the usual prophylactic measures. His family has a history of similar allergies, with all the males on his father's side, including himself and two of his three sons, being allergic to penicillin and tetanus. One of his sons is allergic to a different substance.  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        ROS Per HPI     Objective:    BP 128/78   Pulse 79   Temp 97.9 F (36.6 C) (Oral)   Resp 16   Ht 5' 8.5 (1.74 m)   Wt 188 lb (85.3 kg)   SpO2 97%   BMI 28.17 kg/m  BP Readings from Last 3 Encounters:  08/16/23  128/78  08/02/23 107/70  07/31/23 122/72   Wt Readings from Last 3 Encounters:  08/16/23 188 lb (85.3 kg)  08/02/23 184 lb 9.6 oz (83.7 kg)  07/31/23 189 lb 4.8 oz (85.9 kg)      Physical Exam Constitutional:      General: He is not in acute distress.    Appearance: Normal appearance. He is not ill-appearing.  HENT:     Head: Normocephalic and atraumatic.     Right Ear: External ear normal.     Left Ear: External ear normal.     Nose: Nose normal.  Eyes:     Conjunctiva/sclera: Conjunctivae normal.  Cardiovascular:     Rate and Rhythm: Normal rate and regular rhythm.     Pulses: Normal pulses.     Heart sounds: Normal heart sounds. No murmur heard.    No friction rub.  Pulmonary:     Effort: Pulmonary effort is normal. No respiratory distress.     Breath sounds: Normal breath sounds. No wheezing, rhonchi or rales.  Skin:    General: Skin is warm and dry.     Coloration: Skin is not jaundiced or pale.     Findings: Lesion present.     Comments: Superficial  scalp laceration. Not dirty or bleeding. Slight areas of folded skin at the edges of the wound. No macerated tissue.  Neurological:     Mental Status: He is alert.  Psychiatric:        Mood and Affect: Mood normal.        Behavior: Behavior normal.     No results found for any visits on 08/16/23.      Assessment & Plan:   Scalp Laceration Scalp abrasion from head impact on wood paneling. The wound is superficial, unsuitable for suturing, and will heal by secondary intention. Headache persists since injury. No infection signs currently, but close monitoring is necessary due to inability to receive tetanus vaccine. - Clean wound with saline and cover with bandage - Provide additional bandages for home use - Instruct to apply Aquaphor or Vaseline to the wound - Advise to keep wound covered at work due to exposure to debris - Instruct to monitor for signs of infection or muscle tightening and seek ER care if these  occur - Schedule follow-up appointment in one week to reassess wound  Patient reported allergy to tetanus vaccine, states it is common in his family. States he is unable to receive tetanus vaccine, increasing infection risk from wound.  - Monitor wound closely for signs of infection UPDATE: Discussed with allergist in [place] who recommended giving the vaccination and watching in office- provide with prednisone  if necessary. Will plan to have patient come back for this if agreeable.    Return in about 1 week (around 08/23/2023) for head wound.  Tamaiya Bump T Folasade Mooty, PA-C

## 2023-08-16 NOTE — Patient Instructions (Addendum)
 It was nice to see you today!  Please do not immerse your wound in water. Keep it clean and dry. You may shower but dry it well thereafter.  If you have any problems before your next visit feel free to message me via MyChart (minor issues or questions) or call the office, otherwise you may reach out to schedule an office visit.  Thank you! Jacob Rothfuss, PA-C

## 2023-08-20 ENCOUNTER — Ambulatory Visit (HOSPITAL_BASED_OUTPATIENT_CLINIC_OR_DEPARTMENT_OTHER): Admitting: Student

## 2023-08-22 ENCOUNTER — Telehealth (HOSPITAL_BASED_OUTPATIENT_CLINIC_OR_DEPARTMENT_OTHER): Payer: Self-pay

## 2023-08-22 NOTE — Telephone Encounter (Signed)
 Per Lang, he spoke to Dr. Kazlow and said we could give TDAP to pt and have him sit for 15-30 minutes if pt is agreeable to this. We could also send him home with a steroid. Pt agreed. Pt has appt tomorrow. Advised we could do it then.

## 2023-08-23 ENCOUNTER — Encounter (HOSPITAL_BASED_OUTPATIENT_CLINIC_OR_DEPARTMENT_OTHER): Payer: Self-pay | Admitting: Student

## 2023-08-23 ENCOUNTER — Ambulatory Visit (HOSPITAL_BASED_OUTPATIENT_CLINIC_OR_DEPARTMENT_OTHER): Admitting: Student

## 2023-08-23 VITALS — BP 135/80 | HR 82 | Temp 98.3°F | Resp 16 | Ht 68.5 in | Wt 190.8 lb

## 2023-08-23 DIAGNOSIS — R030 Elevated blood-pressure reading, without diagnosis of hypertension: Secondary | ICD-10-CM

## 2023-08-23 DIAGNOSIS — S0101XD Laceration without foreign body of scalp, subsequent encounter: Secondary | ICD-10-CM | POA: Diagnosis not present

## 2023-08-23 DIAGNOSIS — Z23 Encounter for immunization: Secondary | ICD-10-CM

## 2023-08-23 NOTE — Progress Notes (Signed)
 Acute Office Visit  Subjective:     Patient ID: Devin Lucero, male    DOB: [birth year], 58 y.o.   MRN: [id number]  Chief Complaint  Patient presents with   Medical Management of Chronic Issues    follow up     HPI  Discussed the use of AI scribe software for clinical note transcription with the patient, who gave verbal consent to proceed.  History of Present Illness   Devin Lucero is a 58 year old male who presents for a follow-up after head injury and receiving a tetanus shot.  He has no issues with his head injury, such as pus or drainage. He has been keeping the wound open to the air and clean, using saline at home.  No chest pain, shortness of breath, bruising, or headaches.  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 and mentions some work-related stress due to coworkers not being cooperative. He is also a veteran, having served in [employer], and has not been back to the [place] since his last visit.   Patient was seen last week on 08/16/2023 for an initial encounter about a laceration of his scalp.  He scraped his head on a piece of dirty wood.  After talking this over with an allergist in town, it was decided to still give him tetanus prophylaxis even though he has a prior history of allergy with this.  The allergist believes that this was likely just a normal immunologic response.        ROS Per HPI     Objective:    BP 135/80   Pulse 82   Temp 98.3 F (36.8 C) (Oral)   Resp 16   Ht 5' 8.5 (1.74 m)   Wt 190 lb 12.8 oz (86.5 kg)   SpO2 96%   BMI 28.59 kg/m    Physical Exam Constitutional:      General: He is not in acute distress.    Appearance: Normal appearance. He is not ill-appearing.  HENT:     Head: Normocephalic and atraumatic.     Right Ear: External ear normal.     Left Ear: External ear normal.     Nose: Nose normal.  Eyes:     Conjunctiva/sclera: Conjunctivae normal.  Cardiovascular:     Rate and Rhythm: Normal  rate.     Pulses: Normal pulses.     Heart sounds: Normal heart sounds. No murmur heard.    No friction rub.  Pulmonary:     Effort: Pulmonary effort is normal. No respiratory distress.     Breath sounds: Normal breath sounds. No stridor. No wheezing, rhonchi or rales.  Skin:    General: Skin is warm and dry.     Coloration: Skin is not jaundiced or pale.     Findings: Lesion present. No bruising.     Comments: No erythema or pustular contents to the wound  Neurological:     Mental Status: He is alert.  Psychiatric:        Mood and Affect: Mood normal.        Behavior: Behavior normal.     No results found for any visits on 08/23/23.      Assessment & Plan:   Assessment and Plan    History of immunologic reaction to tetanus vaccination Hx of possible normal immunological response rather than a true anaphylactic allergy. No major symptoms such as chest pain or shortness of breath present. - Monitor for  signs of severe allergic reaction such as chest pain or shortness of breath. - Consider prescribing prednisone  if symptoms worsen. - Stay for observation for 15 minutes - Normal observation.  Normal evaluation prior to patient leaving including breathing and O2 stats tetanus site appeared normal. -Discussed ER precautions.  Healing scalp wound Scalp wound is healing well with no signs of infection such as pus or drainage. - Advise to keep the wound open to air and clean with saline. - Instruct not to pull the scab off prematurely.  Elevated blood pressure Blood pressure elevated, possibly due to situational stress.      Return if symptoms worsen or fail to improve.  Kale Rondeau T Marton Malizia, PA-C

## 2023-08-23 NOTE — Patient Instructions (Signed)
 It was nice to see you today!  If you have any problems before your next visit feel free to message me via MyChart (minor issues or questions) or call the office, otherwise you may reach out to schedule an office visit.  Thank you! Pau Banh, PA-C

## 2023-08-28 ENCOUNTER — Encounter (HOSPITAL_BASED_OUTPATIENT_CLINIC_OR_DEPARTMENT_OTHER): Payer: Self-pay

## 2023-08-31 ENCOUNTER — Encounter (HOSPITAL_BASED_OUTPATIENT_CLINIC_OR_DEPARTMENT_OTHER): Payer: Self-pay | Admitting: Student

## 2023-08-31 ENCOUNTER — Encounter (HOSPITAL_BASED_OUTPATIENT_CLINIC_OR_DEPARTMENT_OTHER): Payer: Self-pay

## 2023-08-31 ENCOUNTER — Ambulatory Visit (HOSPITAL_BASED_OUTPATIENT_CLINIC_OR_DEPARTMENT_OTHER): Admitting: Student

## 2023-08-31 ENCOUNTER — Ambulatory Visit (HOSPITAL_BASED_OUTPATIENT_CLINIC_OR_DEPARTMENT_OTHER)
Admission: RE | Admit: 2023-08-31 | Discharge: 2023-08-31 | Disposition: A | Discharge status: Home / Self Care | Source: Ambulatory Visit | Attending: Student | Admitting: Student

## 2023-08-31 VITALS — BP 118/69 | HR 80 | Temp 98.5°F | Resp 16 | Ht 68.5 in | Wt 191.1 lb

## 2023-08-31 DIAGNOSIS — E11621 Type 2 diabetes mellitus with foot ulcer: Secondary | ICD-10-CM

## 2023-08-31 DIAGNOSIS — L97521 Non-pressure chronic ulcer of other part of left foot limited to breakdown of skin: Secondary | ICD-10-CM

## 2023-08-31 DIAGNOSIS — S91102A Unspecified open wound of left great toe without damage to nail, initial encounter: Secondary | ICD-10-CM | POA: Diagnosis not present

## 2023-08-31 DIAGNOSIS — M19072 Primary osteoarthritis, left ankle and foot: Secondary | ICD-10-CM | POA: Diagnosis not present

## 2023-08-31 DIAGNOSIS — M7732 Calcaneal spur, left foot: Secondary | ICD-10-CM | POA: Diagnosis not present

## 2023-08-31 NOTE — Progress Notes (Signed)
 Acute Office Visit  Subjective:     Patient ID: Devin Lucero, male    DOB: 04-22-1965, 58 y.o.   MRN: [id number]  Chief Complaint  Patient presents with   Sore    Has a wound on left big toe for 1 week. Had a blood blister, drained it but has gotten worse since then.    HPI  Discussed the use of AI scribe software for clinical note transcription with the patient, who gave verbal consent to proceed.  History of Present Illness   Devin Lucero is a 58 year old male with diabetes who presents with toe pain due to a blood blister.  He experiences significant pain in his toe, which began as a blood blister. He drained the blister himself using 'cuticle cutters' after cleaning the area with alcohol and peroxide. The pain is localized around a callus on the bottom of his toe, which he has had for years. The pain is intense, especially when pressure is applied to the callus. No pus is present at the site. No fever or chills.  He has a history of diabetes and reports neuropathy, with numbness extending from a specific point on his foot outward. At night, his feet feel cold to the point of pain, although they are warm to the touch.      ROS Per HPI     Objective:    BP 118/69   Pulse 80   Temp 98.5 F (36.9 C) (Oral)   Resp 16   Ht 5' 8.5 (1.74 m)   Wt 191 lb 1.6 oz (86.7 kg)   SpO2 96%   BMI 28.63 kg/m    Physical Exam Constitutional:      General: He is not in acute distress.    Appearance: Normal appearance. He is not ill-appearing.  HENT:     Head: Normocephalic and atraumatic.     Right Ear: External ear normal.     Left Ear: External ear normal.     Nose: Nose normal.  Eyes:     Conjunctiva/sclera: Conjunctivae normal.  Cardiovascular:     Rate and Rhythm: Normal rate and regular rhythm.     Pulses: Normal pulses.     Heart sounds: Normal heart sounds. No murmur heard.    No friction rub.  Pulmonary:     Effort: Pulmonary effort is normal. No respiratory  distress.     Breath sounds: Normal breath sounds. No wheezing, rhonchi or rales.  Musculoskeletal:     Comments: Left foot, normal pulses- both dorsalis pedis and anterior tibialis. Pain to palpation along the area of the wound. Only appears to have a small opening that is healing well- likely where he drianed the blister. Large callous over the area. Appearance of blood under the skin- no pus, foul odor, or major erythema.  Skin:    General: Skin is warm and dry.     Coloration: Skin is not jaundiced or pale.  Neurological:     Mental Status: He is alert.  Psychiatric:        Mood and Affect: Mood normal.        Behavior: Behavior normal.     No results found for any visits on 08/31/23.      Assessment & Plan:   Assessment and Plan    Left toe blood blister with callus and pain- diabetic ulcer with extension through skin Blood blister on the left toe with associated callus and significant pain. No signs of infection currently,  but due to diabetes, there is a concern for potential complications such as osteomyelitis. Unknown how far the blister extends. The blister was self-drained using clean technique, reducing infection risk. No pus present. Pain is exacerbated by pressure on the callus. - Order x-ray of the left foot to rule out osteomyelitis. - Advise keeping the area clean with soap and water. - Instruct to cover the blister with gauze, especially when wearing boots for work. - Advise wearing socks to protect the area. - Recommend staying off the foot as much as possible to prevent trauma. - Refer to podiatrist if the condition does not improve or if pus develops. - Wrap the toe with gauze in the office for protection.      I personally spent a total of 21 minutes in the care of the patient today including preparing to see the patient, getting/reviewing separately obtained history, performing a medically appropriate exam/evaluation, counseling and educating, placing orders,  and documenting clinical information in the EHR.   Return if symptoms worsen or fail to improve.  Blaize Epple T Biance Moncrief, PA-C

## 2023-08-31 NOTE — Patient Instructions (Signed)
 It was nice to see you today!  If you have any problems before your next visit feel free to message me via MyChart (minor issues or questions) or call the office, otherwise you may reach out to schedule an office visit.  Thank you! Pau Banh, PA-C

## 2023-09-03 DIAGNOSIS — B952 Enterococcus as the cause of diseases classified elsewhere: Secondary | ICD-10-CM | POA: Diagnosis not present

## 2023-09-03 DIAGNOSIS — L080 Pyoderma: Secondary | ICD-10-CM | POA: Diagnosis not present

## 2023-09-04 ENCOUNTER — Encounter: Payer: Self-pay | Admitting: Podiatry

## 2023-09-04 ENCOUNTER — Ambulatory Visit (INDEPENDENT_AMBULATORY_CARE_PROVIDER_SITE_OTHER)

## 2023-09-04 ENCOUNTER — Ambulatory Visit: Admitting: Podiatry

## 2023-09-04 DIAGNOSIS — S90822A Blister (nonthermal), left foot, initial encounter: Secondary | ICD-10-CM

## 2023-09-04 DIAGNOSIS — L03032 Cellulitis of left toe: Secondary | ICD-10-CM

## 2023-09-04 DIAGNOSIS — L089 Local infection of the skin and subcutaneous tissue, unspecified: Secondary | ICD-10-CM

## 2023-09-04 DIAGNOSIS — L97522 Non-pressure chronic ulcer of other part of left foot with fat layer exposed: Secondary | ICD-10-CM

## 2023-09-04 DIAGNOSIS — L02612 Cutaneous abscess of left foot: Secondary | ICD-10-CM | POA: Diagnosis not present

## 2023-09-04 DIAGNOSIS — E1140 Type 2 diabetes mellitus with diabetic neuropathy, unspecified: Secondary | ICD-10-CM | POA: Diagnosis not present

## 2023-09-04 DIAGNOSIS — E1165 Type 2 diabetes mellitus with hyperglycemia: Secondary | ICD-10-CM

## 2023-09-04 MED ORDER — SULFAMETHOXAZOLE-TRIMETHOPRIM 800-160 MG PO TABS: 1.0000 | ORAL_TABLET | Freq: Two times a day (BID) | ORAL | 0 refills | Status: AC

## 2023-09-04 NOTE — Progress Notes (Unsigned)
 Chief Complaint  Patient presents with   Diabetic Ulcer    Left Hallux, started out as a callous and now there now a big blister present, with marking to show how much bigger it has gotten. Looks filled with something, can't tell if it is infection or not. A1c was 8.9 in July, no anti coag.     HPI: 58 y.o. male presents today with concern for blister and infection to the left first toe.  He reports having callus to the area for years.  First noticed it becoming a problem last week and saw his PCP.  He reports history of diabetes.  Last A1c 8.9.  He denies any systemic signs of infection.  He has not been on any antibiotics.  Past Medical History:  Diagnosis Date   Allergy    Anxiety    Blepharitis 01/15/2023   Depression    Diabetes mellitus without complication (HCC)    Dizziness 01/15/2023   GERD (gastroesophageal reflux disease)    Heart attack [facility])    Patient reports mild heart attack around 2018/2019. Stayed at [facility] for a few days. No intervention.   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 No polyps per patient.   COLONOSCOPY WITH PROPOFOL  N/A 10/07/2021   Procedure: COLONOSCOPY WITH PROPOFOL ;  Surgeon: Shaaron Lamar HERO, MD;  [facility];  Service: Endoscopy;  Laterality: N/A;  10:00am   POLYPECTOMY  10/07/2021   Procedure: POLYPECTOMY;  Surgeon: Shaaron Lamar HERO, MD;  [facility];  Service: Endoscopy;;   SHOULDER SURGERY Right     Allergies  Allergen  Reactions   Doxycycline Itching, Other (See Comments), Rash and Hives    flushing  ITCHING   Penicillins Itching and Rash    ITCHING   Tetanus Immune Globulin Rash   Tetanus Toxoid Other (See Comments) and Rash    UNLISTED    Tetanus Toxoids Rash and Other (See Comments)    UNLISTED    Bee Venom Swelling    ROS    Physical Exam: There were no vitals filed for this visit.  General: The patient is alert and oriented x3 in no acute distress.  Dermatology: Left first toe plantar medial IPJ there is hemorrhagic callus present.  There is underlying fluctuance which tracks underneath the IPJ of the first toe and towards the sulcus.  Upon debridement there is significant seropurulent drainage.  Underlying ulceration measures 3.5 x 2.3 x 0.3 cm.  Plantar area with subcutaneous tissue involvement central area of necrotic tissue present but does not appear to track or undermine to the deep fascia.  Viable tissue following debridement.       Vascular: Palpable pedal pulses bilaterally. Capillary refill within normal limits.  No appreciable edema.  Some localized erythema and edema to the left first toe.  Neurological: Light touch sensation decreased to toes.  Protective sensation absent.  Musculoskeletal Exam: Muscle strength 5/5 all major muscle groups.  Hallux limitus noted.  Radiographic Exam: Left foot radiographs 3 views weightbearing 09/04/2023 Normal osseous mineralization. Joint spaces preserved.  No fractures or acute osseous irregularities noted.  Increased soft tissue envelope present over the first toe IPJ plantar medially.  Increased soft tissue density appreciated in this area.  No osteolysis or cortical erosion.  Some valgus drift of the first toe.  Assessment/Plan of Care: 1. Cellulitis and abscess of toe of left foot   2. Poorly controlled type 2 diabetes mellitus with neuropathy (HCC)   3. Skin ulcer of toe of left foot with fat layer exposed (HCC)      Meds ordered  this encounter  Medications   sulfamethoxazole -trimethoprim  (BACTRIM  DS) 800-160 MG tablet    Sig: Take 1 tablet by mouth 2 (two) times daily for 14 days.    Dispense:  28 tablet    Refill:  0   None  Discussed clinical findings with patient today.  Radiographs reviewed with the patient  # Cellulitis left first toe - Infectious workup ordered with CBC, CRP, sed rate -Starting patient on empiric Bactrim  -Monitor for signs of worsening infection - Follow-up in 2 weeks for reevaluation  # Abscess of left first toe with underlying ulceration - Written consent obtained to perform incision and drainage of left first toe.  Anesthesia not necessary secondary to neuropathy - Skin prepped with Betadine  -Did excise nonviable tissue overlying the ulceration using 15 blade and tissue nippers to healthy bleeding base to the level of subcutaneous tissue plantar aspect.  See wound measurements above - Upon reaching layer of infected hematoma, heavy seropurulent drainage noted.  Wound culture obtained from this area. - Irrigated area with saline and cleansed with wound cleanser - Betadine  wet-to-dry dressing applied - Reviewed offloading with patient - Surgical shoe fitted and dispensed for patient - Practice limited stance and gait - Daily dressing changes -I certify that this diagnosis represents a distinct and separate diagnosis that requires evaluation and treatment separate from other procedures or diagnosis   # Poorly controlled diabetes with neuropathy Patient educated on diabetes. Discussed proper diabetic foot care and discussed risks and complications of disease. Educated patient in depth on reasons to return to the office immediately should he/she discover anything concerning or new on the feet. All questions answered. Discussed proper shoes as well.     Summer Parthasarathy L. Lamount MAUL, AACFAS [facility]     [address] [place],  [place] [address]                Office [phone]  Fax [phone]

## 2023-09-05 LAB — CBC WITH DIFFERENTIAL/PLATELET
Basophils Absolute: 0 x10E3/uL (ref 0.0–0.2)
Basos: 0 %
EOS (ABSOLUTE): 0.1 x10E3/uL (ref 0.0–0.4)
Eos: 2 %
Hematocrit: 41 % (ref 37.5–51.0)
Hemoglobin: 12.3 g/dL — ABNORMAL LOW (ref 13.0–17.7)
Immature Grans (Abs): 0 x10E3/uL (ref 0.0–0.1)
Immature Granulocytes: 0 %
Lymphocytes Absolute: 1.3 x10E3/uL (ref 0.7–3.1)
Lymphs: 18 %
MCH: 26.6 pg (ref 26.6–33.0)
MCHC: 30 g/dL — ABNORMAL LOW (ref 31.5–35.7)
MCV: 89 fL (ref 79–97)
Monocytes Absolute: 0.6 x10E3/uL (ref 0.1–0.9)
Monocytes: 8 %
Neutrophils Absolute: 5.2 x10E3/uL (ref 1.4–7.0)
Neutrophils: 72 %
Platelets: 264 x10E3/uL (ref 150–450)
RBC: 4.63 x10E6/uL (ref 4.14–5.80)
RDW: 14.2 % (ref 11.6–15.4)
WBC: 7.3 x10E3/uL (ref 3.4–10.8)

## 2023-09-05 LAB — C-REACTIVE PROTEIN: CRP: 7 mg/L (ref 0–10)

## 2023-09-05 LAB — SEDIMENTATION RATE: Sed Rate: 34 mm/h — ABNORMAL HIGH (ref 0–30)

## 2023-09-06 ENCOUNTER — Ambulatory Visit: Payer: Self-pay | Admitting: Podiatry

## 2023-09-06 ENCOUNTER — Encounter: Payer: Self-pay | Admitting: Podiatry

## 2023-09-13 NOTE — Progress Notes (Signed)
 Attempted to call patient, number was wrong I n chart, I will attempt to send message via mychart. CT

## 2023-09-14 ENCOUNTER — Ambulatory Visit (HOSPITAL_BASED_OUTPATIENT_CLINIC_OR_DEPARTMENT_OTHER): Payer: Self-pay | Admitting: Student

## 2023-09-18 ENCOUNTER — Ambulatory Visit (INDEPENDENT_AMBULATORY_CARE_PROVIDER_SITE_OTHER): Admitting: Podiatry

## 2023-09-18 ENCOUNTER — Encounter: Payer: Self-pay | Admitting: Podiatry

## 2023-09-18 DIAGNOSIS — E1165 Type 2 diabetes mellitus with hyperglycemia: Secondary | ICD-10-CM

## 2023-09-18 DIAGNOSIS — E1140 Type 2 diabetes mellitus with diabetic neuropathy, unspecified: Secondary | ICD-10-CM

## 2023-09-18 DIAGNOSIS — L97522 Non-pressure chronic ulcer of other part of left foot with fat layer exposed: Secondary | ICD-10-CM

## 2023-09-18 NOTE — Progress Notes (Unsigned)
 Chief Complaint  Patient presents with   Wound Check    Left hallux ulcer.  Open, white. Covered with a bandaid.     HPI: 58 y.o. male presenting today for wound care for left hallux ulceration.  Has been taking antibiotics.  Infection does appear improved.  Last A1c 8.9.  Poorly controlled diabetes.  Has been compliant with wound care and using Betadine  wet-to-dry dressings.  Cultures were positive for Enterobacter susceptible to Bactrim .  Past Medical History:  Diagnosis Date   Allergy    Anxiety    Blepharitis 01/15/2023   Depression    Diabetes mellitus without complication (HCC)    Dizziness 01/15/2023   GERD (gastroesophageal reflux disease)    Heart attack [facility])    Patient reports mild heart attack around 2018/2019. Stayed at [facility] for a few days. No intervention.   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 No polyps per patient.   COLONOSCOPY WITH PROPOFOL  N/A 10/07/2021   Procedure: COLONOSCOPY WITH PROPOFOL ;  Surgeon: Shaaron Lamar HERO, MD;  [facility];  Service: Endoscopy;  Laterality: N/A;  10:00am   POLYPECTOMY  10/07/2021   Procedure: POLYPECTOMY;  Surgeon: Shaaron Lamar HERO, MD;  [facility];  Service: Endoscopy;;   SHOULDER SURGERY Right    Allergies  Allergen Reactions   Doxycycline Itching, Other (See Comments), Rash and Hives    flushing  ITCHING   Penicillins Itching and Rash    ITCHING   Tetanus Immune Globulin Rash   Tetanus Toxoid Other (See  Comments) and Rash    UNLISTED    Tetanus Toxoid-Containing Vaccines Rash and Other (See Comments)    UNLISTED    Bee Venom Swelling      PHYSICAL EXAM: There were no vitals filed for this visit.  General: The patient is alert and oriented x3 in no acute distress.  Dermatology: Skin is warm, dry and supple bilateral lower extremities. Interspaces are clear of maceration and debris.      Wound 1:  Location: Left hallux plantar IPJ        Depth: Subcutaneous tissue        Wound Border: Macerated, friable        Wound Base: Nonviable tissue, underlying granulation tissue present        Drainage: Scant serous       Odor?:  none        Surrounding Tissue: Somewhat friable, macerated periwound        Infected?:  Decreased cellulitic        Necrosis?:  nonviable fibrotic slough        Pain?:  no        Tunneling:  no       Dimensions (cm): Predebridement measurements 1.2 x 0.3 x 0.2 cm.  Postdebridement measurements 1.4 x 0.4 x 0.3 cm  Vascular: DP and PT pedal pulses are palpable, CFT intact.  Neurological: Light touch sensation decreased, protective sensation decreased.  Musculoskeletal Exam:  Functional hallux limitus present     Latest Ref Rng & Units 02/19/2023    8:25 AM  Hemoglobin A1C  Hemoglobin-A1c 4.8 - 5.6 % 7.2      ASSESSMENT / PLAN OF CARE: 1. Poorly controlled type 2 diabetes mellitus with neuropathy (HCC)   2. Skin ulcer of toe of left foot with fat layer exposed (HCC)      No orders of the defined types were placed in this encounter.  None   The ulceration was sharply debrided of hyperkeratotic and devitalized soft tissue with sterile #15 blade to the level of subcutaneous tissue.  Wound measurements as described above.  75% area of nonviable tissue was excisionally debrided hemostasis obtained.  Antibiotic Silvadene cream and DSD applied.  Reviewed off-loading with patient.  Continue with use of surgical shoe.  Reviewed offloading with patient.   Offloading felt padding applied  Complete course of oral Bactrim .  Reviewed daily dressing changes with patient.  Discussed risks / concerns regarding ulcer with patient and possible sequelae if left untreated.  Stressed importance of infection prevention at home. Short-term goals are: prevent infection, off-load ulcer, heal ulcer Long-term goals are:  prevent recurrence, prevent amputation.   Return in about 2 weeks (around 10/02/2023) for Wound Care.   Deamonte Sayegh L. Lamount MAUL, FACFAS [facility]     [address] [place], [place] [address]                Office [phone]  Fax [phone]

## 2023-09-27 ENCOUNTER — Ambulatory Visit (HOSPITAL_BASED_OUTPATIENT_CLINIC_OR_DEPARTMENT_OTHER): Admitting: Student

## 2023-10-01 ENCOUNTER — Encounter: Payer: Self-pay | Admitting: Podiatry

## 2023-10-01 ENCOUNTER — Ambulatory Visit: Admitting: Podiatry

## 2023-10-01 DIAGNOSIS — E1140 Type 2 diabetes mellitus with diabetic neuropathy, unspecified: Secondary | ICD-10-CM | POA: Diagnosis not present

## 2023-10-01 DIAGNOSIS — M205X2 Other deformities of toe(s) (acquired), left foot: Secondary | ICD-10-CM | POA: Diagnosis not present

## 2023-10-01 DIAGNOSIS — E1165 Type 2 diabetes mellitus with hyperglycemia: Secondary | ICD-10-CM | POA: Diagnosis not present

## 2023-10-01 NOTE — Progress Notes (Unsigned)
 Chief Complaint  Patient presents with   Wound Check    Left hallux, ulcer. Scabbed and smaller then on 9/02. Looks a lot better. Soaking, with blade handle in Rm and wound basket. A1c 8.9 at the [facility] patient reported, 7.2 is what is listed from Feb No anti coag.     HPI: 58 y.o. male presenting today for wound care for left hallux ulceration.  Has been taking antibiotics.  He reports that the area continues to improve.  Last A1c 8.9.  Past Medical History:  Diagnosis Date   Allergy    Anxiety    Blepharitis 01/15/2023   Depression    Diabetes mellitus without complication (HCC)    Dizziness 01/15/2023   GERD (gastroesophageal reflux disease)    Heart [facility])    Patient reports mild heart attack around 2018/2019. Stayed at [facility] for a few days. No intervention.   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 No polyps per patient.   COLONOSCOPY WITH PROPOFOL  N/A 10/07/2021   Procedure: COLONOSCOPY WITH PROPOFOL ;  Surgeon: Shaaron Lamar HERO, MD;  [facility];  Service: Endoscopy;  Laterality: N/A;  10:00am   POLYPECTOMY  10/07/2021   Procedure: POLYPECTOMY;  Surgeon: Shaaron Lamar HERO, MD;  [facility];  Service: Endoscopy;;   SHOULDER SURGERY Right    Allergies  Allergen Reactions   Doxycycline Itching, Other (See Comments), Rash and Hives    flushing  ITCHING   Penicillins Itching and Rash    ITCHING   Tetanus Immune Globulin Rash   Tetanus Toxoid Other (See Comments)  and Rash    UNLISTED    Tetanus Toxoid-Containing Vaccines Rash and Other (See Comments)    UNLISTED    Bee Venom Swelling      PHYSICAL EXAM: There were no vitals filed for this visit.  General: The patient is alert and oriented x3 in no acute distress.  Dermatology: Skin is warm, dry and supple bilateral lower extremities. Interspaces are clear of maceration and debris.      Area of ulceration left first toe plantar IPJ appears healed.  There is some overlying callus without underlying ulceration at this point.  No signs of infection.  No erythema.  Vascular: DP and PT pedal pulses are palpable, CFT intact.  Neurological: Light touch sensation decreased, protective sensation decreased.  Musculoskeletal Exam: Functional hallux limitus present     Latest Ref Rng & Units 02/19/2023    8:25 AM  Hemoglobin A1C  Hemoglobin-A1c 4.8 - 5.6 % 7.2      ASSESSMENT / PLAN OF CARE: 1. Poorly controlled type 2 diabetes mellitus with neuropathy (HCC)   2. Hallux limitus, left       No orders of the defined types were placed in this encounter.  None  Findings discussed with patient.  The ulceration does appear to have healed at this point.  Instructed patient to continue to monitor closely.  Monitor for signs of recurrence.  Can  recheck in 4 weeks if there is any concerns.  Did briefly discuss decreased range of motion in left first toe which is increasing pressure to the area.  Recommend continued offloading in the interim.   Return in about 4 weeks (around 10/29/2023), or if symptoms worsen or fail to improve, for Ulcer check.   Curtistine Pettitt L. Lamount MAUL, FACFAS [facility]     [address] [place], [place] [address]                Office [phone]  Fax [phone]

## 2023-10-16 ENCOUNTER — Ambulatory Visit (HOSPITAL_BASED_OUTPATIENT_CLINIC_OR_DEPARTMENT_OTHER): Admitting: Student

## 2023-10-16 ENCOUNTER — Encounter (HOSPITAL_BASED_OUTPATIENT_CLINIC_OR_DEPARTMENT_OTHER): Payer: Self-pay | Admitting: Student

## 2023-10-16 VITALS — BP 121/78 | HR 84 | Temp 98.2°F | Resp 16 | Ht 68.5 in | Wt 188.8 lb

## 2023-10-16 DIAGNOSIS — L03114 Cellulitis of left upper limb: Secondary | ICD-10-CM | POA: Diagnosis not present

## 2023-10-16 MED ORDER — CEFUROXIME AXETIL 500 MG PO TABS: 500.0000 mg | ORAL_TABLET | Freq: Two times a day (BID) | ORAL | 0 refills | Status: AC

## 2023-10-16 NOTE — Progress Notes (Signed)
 Acute Office Visit  Subjective:     Patient ID: Devin Lucero, male    DOB: Jan 14, 1966, 58 y.o.   MRN: [id number]  Chief Complaint  Patient presents with   Wound Check    Injury by rivot head and drill bit. Wound is the size of a quarter and it still hurting. Incident occured last week. Wound is better but looks red on left wrist. Every time he changes the bandages, the hole gets deeper.    Discussed the use of AI scribe software for clinical note transcription with the patient, who gave verbal consent to proceed.  History of Present Illness   Devin Lucero is a 59 year old male who presents with a worsening wound on his arm.  He has a wound on his arm that began as a scratch on October 09, 2023. Since yesterday evening, the wound has become deeper and turned pink, and the patient reports redness and pain around the area.  He has been cleaning the wound with a solution previously provided for his toe and covering it with a bandage.  He has a known allergy to penicillins, which caused a high fever and rash, and doxycycline, which caused his hands and ears to feel hot and itch all over. He has not previously taken Keflex and is unaware of any reactions to cephalosporins. No facial swelling or trouble breathing with penicillins.        ROS Per HPI     Objective:    BP 121/78   Pulse 84   Temp 98.2 F (36.8 C) (Oral)   Resp 16   Ht 5' 8.5 (1.74 m)   Wt 188 lb 12.8 oz (85.6 kg)   SpO2 96%   BMI 28.29 kg/m    Physical Exam Constitutional:      General: He is not in acute distress.    Appearance: Normal appearance. He is not ill-appearing.  HENT:     Head: Normocephalic and atraumatic.     Right Ear: External ear normal.     Left Ear: External ear normal.     Nose: Nose normal.  Eyes:     Conjunctiva/sclera: Conjunctivae normal.  Cardiovascular:     Rate and Rhythm: Normal rate.  Skin:    General: Skin is warm and dry.     Coloration: Skin is not jaundiced  or pale.     Findings: Erythema and lesion present.  Neurological:     Mental Status: He is alert.  Psychiatric:        Mood and Affect: Mood normal.        Behavior: Behavior normal.     No results found for any visits on 10/16/23.      Assessment & Plan:   Assessment and Plan    Cellulitis of arm Cellulitis of the arm, likely secondary to a scratch on October 09, 2023. Increasing redness and pain suggest infection. Recent pink discoloration indicates early cellulitis. He has been cleaning the wound and applying a bandage. - Prescribe cefuroxime bid for 5 days - Advise applying a small amount of Vaseline to the wound to prevent bacterial entry. - Instruct to monitor for signs of excessive moisture, such as white edges around the wound, and adjust Vaseline application accordingly. - Advise to return for evaluation if the condition does not improve.  Rate of cross reactivity of 2nd gen cephalosporin with penicillin is <1% as they do not share similar side chains with the penicillins. Patient is agreeable.  Return if symptoms worsen or fail to improve.  Obdulia Steier T Airel Magadan, PA-C

## 2023-10-16 NOTE — Patient Instructions (Signed)
 It was nice to see you today!  As we discussed in clinic:  It looks like you have the beginnings of cellulitis from the wound on your arm. I will give you an antibiotic that is similar to a penicillin, so if you develop any itching from it, please let me know and take some benedryl as well. Please clean the wound daily and you may apply a thin layer of vaseline to keep bacteria form being able to get into the wound.   Please come back to see me if the wound is not getting any better after finishing the antibiotic.  If you have any problems before your next visit feel free to message me via MyChart (minor issues or questions) or call the office, otherwise you may reach out to schedule an office visit.  Thank you! Naleyah Ohlinger, PA-C

## 2023-11-01 ENCOUNTER — Telehealth: Payer: Self-pay | Admitting: Podiatry

## 2023-11-01 NOTE — Telephone Encounter (Signed)
 Patient is requesting documentation confirming that he was seen for a "Diabetic Ulcer" during his last visit on 10/01/2023 for insurance purposes. Please upload the documentation to his MyChart. Thank you.

## 2023-11-05 NOTE — Telephone Encounter (Signed)
 10/31/2023 wound check provider states was in a healed state that is why it is not a diabetic ulcer diagnosis.

## 2023-11-19 ENCOUNTER — Ambulatory Visit (HOSPITAL_BASED_OUTPATIENT_CLINIC_OR_DEPARTMENT_OTHER)
Admission: RE | Admit: 2023-11-19 | Discharge: 2023-11-19 | Disposition: A | Source: Ambulatory Visit | Attending: Student | Admitting: Radiology

## 2023-11-19 ENCOUNTER — Ambulatory Visit (HOSPITAL_BASED_OUTPATIENT_CLINIC_OR_DEPARTMENT_OTHER): Admitting: Student

## 2023-11-19 VITALS — BP 162/88 | HR 81 | Temp 98.3°F | Resp 16 | Ht 68.5 in | Wt 189.8 lb

## 2023-11-19 DIAGNOSIS — S4991XA Unspecified injury of right shoulder and upper arm, initial encounter: Secondary | ICD-10-CM | POA: Diagnosis not present

## 2023-11-19 DIAGNOSIS — E1159 Type 2 diabetes mellitus with other circulatory complications: Secondary | ICD-10-CM

## 2023-11-19 DIAGNOSIS — W19XXXA Unspecified fall, initial encounter: Secondary | ICD-10-CM

## 2023-11-19 DIAGNOSIS — I152 Hypertension secondary to endocrine disorders: Secondary | ICD-10-CM | POA: Diagnosis not present

## 2023-11-19 DIAGNOSIS — E1140 Type 2 diabetes mellitus with diabetic neuropathy, unspecified: Secondary | ICD-10-CM

## 2023-11-19 DIAGNOSIS — M7521 Bicipital tendinitis, right shoulder: Secondary | ICD-10-CM

## 2023-11-19 DIAGNOSIS — E1169 Type 2 diabetes mellitus with other specified complication: Secondary | ICD-10-CM

## 2023-11-19 DIAGNOSIS — E785 Hyperlipidemia, unspecified: Secondary | ICD-10-CM

## 2023-11-19 DIAGNOSIS — Z043 Encounter for examination and observation following other accident: Secondary | ICD-10-CM | POA: Diagnosis not present

## 2023-11-19 MED ORDER — MELOXICAM 7.5 MG PO TABS: 7.5000 mg | ORAL_TABLET | Freq: Every day | ORAL | 0 refills | Status: DC

## 2023-11-19 NOTE — Patient Instructions (Signed)
 It was nice to see you today!  If you have any problems before your next visit feel free to message me via MyChart (minor issues or questions) or call the office, otherwise you may reach out to schedule an office visit.  Thank you! Pau Banh, PA-C

## 2023-11-19 NOTE — Progress Notes (Signed)
 Acute Office Visit  Subjective:     Patient ID: Devin Lucero, male    DOB: 07-19-1965, 58 y.o.   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 Right side of neck down past right elbow has been hurting. Landed on right side on concrete.     HPI  Discussed the use of AI scribe software for clinical note transcription with the patient, who gave verbal consent to proceed.  History of Present Illness   Devin Lucero is a 58 year old male who presents with right shoulder and arm pain following a fall.  He experiences pain in his right shoulder and arm after a fall while pulling an electric line through the floor joists in the basement. He stepped on a slick stool, lost his balance, and landed on his right side on a concrete floor. The pain is localized to the right shoulder and extends down the arm. It is severe, especially when lifting objects, such as a 16-ounce coffee cup, which causes significant aching in the center of his forearm. He compares the sensation to the ache of a broken bone. The pain worsens at night, affecting his sleep, and he has to switch sides to alleviate discomfort. No tingling sensations in the arm. However, he notes weakness in the right arm, affecting his ability to perform tasks at work, such as lifting a nail gun.  He has a history of type 2 diabetes and high cholesterol, primarily managed through the [facility]. His blood sugar levels have been erratic for the past two weeks, possibly due to stress, and his blood pressure is slightly elevated. He is currently taking meloxicam , which he recalls helped previously, and he is also on Zoloft and Lexapro for mood management.  He lives with his son, daughter-in-law, and three grandchildren, which has been a source of stress.      ROS Per HPI     Objective:    BP (!) 162/88   Pulse 81   Temp 98.3 F (36.8 C) (Oral)   Resp 16   Ht 5' 8.5 (1.74 m)   Wt  189 lb 12.8 oz (86.1 kg)   SpO2 96%   BMI 28.44 kg/m  BP Readings from Last 3 Encounters:  11/19/23 (!) 162/88  10/16/23 121/78  08/31/23 118/69   Wt Readings from Last 3 Encounters:  11/19/23 189 lb 12.8 oz (86.1 kg)  10/16/23 188 lb 12.8 oz (85.6 kg)  08/31/23 191 lb 1.6 oz (86.7 kg)   SpO2 Readings from Last 3 Encounters:  11/19/23 96%  10/16/23 96%  08/31/23 96%      Physical Exam Constitutional:      General: He is not in acute distress.    Appearance: Normal appearance. He is not ill-appearing.  HENT:     Head: Normocephalic and atraumatic.     Right Ear: External ear normal.     Left Ear: External ear normal.     Nose: Nose normal.  Eyes:     Conjunctiva/sclera: Conjunctivae normal.  Cardiovascular:     Rate and Rhythm: Normal rate and regular rhythm.     Pulses: Normal pulses.     Heart sounds: Normal heart sounds. No murmur heard.    No friction rub.  Pulmonary:     Effort: Pulmonary effort is normal. No respiratory distress.     Breath sounds: Normal breath sounds. No wheezing, rhonchi or rales.  Musculoskeletal:  Comments: R shoulder. Grossly normal to inspection. Seems to have good sensation and blood flow. Tenderness to proximal bicipital groove. Tenderness to bicep.  ROM mostly normal. Weakness due to pain. Painful supination.  R wrist. Tenderness to palpation of distal forearm with compression.  Skin:    General: Skin is warm and dry.     Coloration: Skin is not jaundiced or pale.  Neurological:     Mental Status: He is alert.  Psychiatric:        Mood and Affect: Mood normal.        Behavior: Behavior normal.     No results found for any visits on 11/19/23.      Assessment & Plan:    Assessment and Plan    Right biceps tendinitis and right forearm/wrist pain, possible injury Right biceps tendinitis with associated right forearm and wrist pain following a fall. Pain exacerbated by lifting and supination, with weakness likely  due to pain rather than a complete tear. Differential includes tendinitis versus partial tear, but no evidence of complete tear on examination. Pain and weakness likely due to inflammation and tendinitis. - Ordered right forearm and wrist x-ray - Advised use of a sling for the right arm - Recommended ice application 15 minutes on, 15 minutes off, several times per day - Prescribed meloxicam  for inflammation - Advised against using ibuprofen or Aleve; Tylenol is permissible for pain - Recommended rest and avoidance of using the right arm - Provided information on biceps tendinitis and rehabilitation exercises to start after three weeks - Will consider physical therapy if symptoms persist after a few weeks  Type 2 diabetes mellitus Blood sugar levels have been fluctuating, possibly due to stress. Current management through the [facility]. - Ordered A1c test to assess current glycemic control  Hyperlipidemia Management through the [facility]. - Assess lipid panel  Depression Experiencing significant stress due to family dynamics. Currently seeing a counselor and has a new therapist appointment scheduled. On Zoloft AND Lexapro for mood control?? - Encouraged follow-up with therapist and counselor - fu with psych to assess meds     Hypertension - suspect familial issues playing into BP - assess cbc and cmp  Return in about 6 weeks (around 12/31/2023), or if symptoms worsen or fail to improve, for biceps tendonitis.  Leane Loring T Kelani Robart, PA-C

## 2023-11-20 ENCOUNTER — Ambulatory Visit (HOSPITAL_BASED_OUTPATIENT_CLINIC_OR_DEPARTMENT_OTHER): Payer: Self-pay | Admitting: Student

## 2023-11-20 LAB — COMPREHENSIVE METABOLIC PANEL WITH GFR
ALT: 15 IU/L (ref 0–44)
AST: 24 IU/L (ref 0–40)
Albumin: 4.6 g/dL (ref 3.8–4.9)
Alkaline Phosphatase: 131 IU/L — ABNORMAL HIGH (ref 47–123)
BUN/Creatinine Ratio: 16 (ref 9–20)
BUN: 18 mg/dL (ref 6–24)
Bilirubin Total: 0.3 mg/dL (ref 0.0–1.2)
CO2: 18 mmol/L — ABNORMAL LOW (ref 20–29)
Calcium: 9.6 mg/dL (ref 8.7–10.2)
Chloride: 105 mmol/L (ref 96–106)
Creatinine, Ser: 1.16 mg/dL (ref 0.76–1.27)
Globulin, Total: 2.9 g/dL (ref 1.5–4.5)
Glucose: 183 mg/dL — ABNORMAL HIGH (ref 70–99)
Potassium: 4.9 mmol/L (ref 3.5–5.2)
Sodium: 142 mmol/L (ref 134–144)
Total Protein: 7.5 g/dL (ref 6.0–8.5)
eGFR: 73 mL/min/1.73 (ref >59)

## 2023-11-20 LAB — CBC WITH DIFFERENTIAL/PLATELET
Basophils Absolute: 0.1 x10E3/uL (ref 0.0–0.2)
Basos: 1 %
EOS (ABSOLUTE): 0.2 x10E3/uL (ref 0.0–0.4)
Eos: 2 %
Hematocrit: 42.6 % (ref 37.5–51.0)
Hemoglobin: 13.6 g/dL (ref 13.0–17.7)
Immature Grans (Abs): 0 x10E3/uL (ref 0.0–0.1)
Immature Granulocytes: 0 %
Lymphocytes Absolute: 1.7 x10E3/uL (ref 0.7–3.1)
Lymphs: 22 %
MCH: 28.5 pg (ref 26.6–33.0)
MCHC: 31.9 g/dL (ref 31.5–35.7)
MCV: 89 fL (ref 79–97)
Monocytes Absolute: 0.6 x10E3/uL (ref 0.1–0.9)
Monocytes: 8 %
Neutrophils Absolute: 5.4 x10E3/uL (ref 1.4–7.0)
Neutrophils: 67 %
Platelets: 259 x10E3/uL (ref 150–450)
RBC: 4.78 x10E6/uL (ref 4.14–5.80)
RDW: 14.2 % (ref 11.6–15.4)
WBC: 7.9 x10E3/uL (ref 3.4–10.8)

## 2023-11-20 LAB — HEMOGLOBIN A1C
Est. average glucose Bld gHb Est-mCnc: 180 mg/dL
Hgb A1c MFr Bld: 7.9 % — ABNORMAL HIGH (ref 4.8–5.6)

## 2023-11-20 LAB — LIPID PANEL
Chol/HDL Ratio: 4.5 ratio (ref 0.0–5.0)
Cholesterol, Total: 196 mg/dL (ref 100–199)
HDL: 44 mg/dL (ref >39)
LDL Chol Calc (NIH): 114 mg/dL — ABNORMAL HIGH (ref 0–99)
Triglycerides: 219 mg/dL — ABNORMAL HIGH (ref 0–149)
VLDL Cholesterol Cal: 38 mg/dL (ref 5–40)

## 2023-12-31 ENCOUNTER — Ambulatory Visit (HOSPITAL_BASED_OUTPATIENT_CLINIC_OR_DEPARTMENT_OTHER): Admitting: Student

## 2024-01-01 ENCOUNTER — Ambulatory Visit (HOSPITAL_BASED_OUTPATIENT_CLINIC_OR_DEPARTMENT_OTHER): Admitting: Student

## 2024-01-07 ENCOUNTER — Ambulatory Visit (HOSPITAL_BASED_OUTPATIENT_CLINIC_OR_DEPARTMENT_OTHER)
Admission: RE | Admit: 2024-01-07 | Discharge: 2024-01-07 | Disposition: A | Source: Ambulatory Visit | Attending: Student | Admitting: Radiology

## 2024-01-07 ENCOUNTER — Encounter (HOSPITAL_BASED_OUTPATIENT_CLINIC_OR_DEPARTMENT_OTHER): Payer: Self-pay | Admitting: Student

## 2024-01-07 ENCOUNTER — Ambulatory Visit (HOSPITAL_BASED_OUTPATIENT_CLINIC_OR_DEPARTMENT_OTHER): Admitting: Student

## 2024-01-07 ENCOUNTER — Ambulatory Visit (HOSPITAL_BASED_OUTPATIENT_CLINIC_OR_DEPARTMENT_OTHER): Payer: Self-pay | Admitting: Student

## 2024-01-07 VITALS — BP 154/87 | HR 86 | Temp 98.0°F | Resp 16 | Ht 68.5 in | Wt 192.6 lb

## 2024-01-07 DIAGNOSIS — G4733 Obstructive sleep apnea (adult) (pediatric): Secondary | ICD-10-CM

## 2024-01-07 DIAGNOSIS — E113319 Type 2 diabetes mellitus with moderate nonproliferative diabetic retinopathy with macular edema, unspecified eye: Secondary | ICD-10-CM

## 2024-01-07 DIAGNOSIS — W19XXXA Unspecified fall, initial encounter: Secondary | ICD-10-CM | POA: Diagnosis not present

## 2024-01-07 DIAGNOSIS — F339 Major depressive disorder, recurrent, unspecified: Secondary | ICD-10-CM

## 2024-01-07 DIAGNOSIS — M7521 Bicipital tendinitis, right shoulder: Secondary | ICD-10-CM | POA: Diagnosis not present

## 2024-01-07 DIAGNOSIS — M25531 Pain in right wrist: Secondary | ICD-10-CM | POA: Diagnosis not present

## 2024-01-07 MED ORDER — MELOXICAM 7.5 MG PO TABS: 7.5000 mg | ORAL_TABLET | Freq: Every day | ORAL | 0 refills | Status: AC

## 2024-01-07 NOTE — Progress Notes (Unsigned)
 "  Established Patient Office Visit  Subjective   Patient ID: Devin Lucero, male    DOB: [birth year]  Age: 58 y.o. MRN: [id number]  Chief Complaint  Patient presents with   Medical Management of Chronic Issues    Follow up. Memory test 1.5 weeks ago. Will have a new sleep test done. Last time he was told he stopped breathing 30 times in hour. Should be getting a CPAP.    Wrist Pain    Hit right wrist on a counter where he had carpal tunnel surgery down. Pt states it hurts.     HPI  Discussed the use of AI scribe software for clinical note transcription with the patient, who gave verbal consent to proceed.  History of Present Illness   Devin Lucero is a 58 year old male who presents with right wrist pain after a fall.  He presents with right wrist pain following a fall that occurred on Saturday. The pain is located on the ulnar side of the wrist, near the site of a previous carpal tunnel surgery incision. It is described as sharp and severe, especially when picking up objects, and feels like 'somebody's sticking a knife in it.' The pain is exacerbated by movements and activities such as picking up a drink. No electric type pain is reported.  The fall occurred while he was assembling Christmas presents, during which he got tangled and caught himself on the edge of a counter with his right hand. He describes the fall as a 'fall on an outstretched hand' and notes experiencing a sharp pain upon impact. The pain is primarily located just past the bone, more in the joint area, and is tender to touch.  He has a history of carpal tunnel surgery on the affected wrist. He is currently taking meloxicam  and omeprazole .  He also mentions a history of sleep apnea, previously evaluated in 2017, with a maximum oxygen level of 64% and 30 apneic events per hour. He has a CPAP machine but experiences discomfort with the mask, leading to interrupted sleep.  He is on Zepbound for diabetes management, which  has improved his blood sugar readings and is intended to aid in weight loss. He is also taking medications for depression and anxiety, including Zoloft and Lexapro.      Patient Active Problem List   Diagnosis Date Noted   Persons encountering health services in other specified circumstances 08/02/2023   Type 2 diabetes mellitus with diabetic polyneuropathy (HCC) 08/02/2023   Type 2 diabetes mellitus with hyperglycemia (HCC) 08/02/2023   Exposure to potentially hazardous substance 07/31/2023   Bee sting    Erectile dysfunction 03/01/2023   Night sweats 03/01/2023   Anemia 03/01/2023   Abnormal level of hormones in specimens from male genital organs 01/15/2023   TIA (transient ischemic attack) 01/15/2023   Tachycardia 01/15/2023   Sleep apnea 01/15/2023   Seizure (HCC) 01/15/2023   Peptic ulcer 01/15/2023   Multiple joint pain 01/15/2023   Migraine 01/15/2023   DDD (degenerative disc disease), cervical 01/15/2023   Acute posttraumatic stress disorder following military combat 01/15/2023   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) 01/15/2023   Constipation 07/22/2021   Posttraumatic stress disorder 06/30/2021   Generalized anxiety disorder 06/21/2021   Gastroesophageal reflux disease without esophagitis 06/21/2021   Hypertension associated with type 2 diabetes mellitus (HCC) 06/21/2021   Vitamin D  deficiency 06/21/2021   Vitamin B12 deficiency 06/21/2021   Localization-related (focal) (partial) symptomatic epilepsy and epileptic  syndromes with complex partial seizures, intractable, without status epilepticus (HCC) 06/21/2021   Hyperlipidemia associated with type 2 diabetes mellitus (HCC) 07/31/2014   Depression, recurrent (HCC) 07/31/2014   S/P arthroscopy of shoulder 03/30/2014   Male erectile disorder (CODE) 01/17/2012   Occupational exposure to environmental pollution 08/16/1988   Past Medical History:  Diagnosis Date    Allergy    Anxiety    Blepharitis 01/15/2023   Depression    Diabetes mellitus without complication (HCC)    Dizziness 01/15/2023   GERD (gastroesophageal reflux disease)    Heart attack (HCC)    Patient reports mild heart attack around 2018/2019. Stayed at [facility] for a few days. No intervention.   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    Objective:     BP (!) 154/87   Pulse 86   Temp 98 F (36.7 C) (Oral)   Resp 16   Ht 5' 8.5 (1.74 m)   Wt 192 lb 9.6 oz (87.4 kg)   SpO2 95%   BMI 28.86 kg/m  BP Readings from Last 3 Encounters:  01/07/24 (!) 154/87  11/19/23 (!) 162/88  10/16/23 121/78   Wt Readings from Last 3 Encounters:  01/07/24 192 lb 9.6 oz (87.4 kg)  11/19/23 189 lb 12.8 oz (86.1 kg)  10/16/23 188 lb 12.8 oz (85.6 kg)   SpO2 Readings from Last 3 Encounters:  01/07/24 95%  11/19/23 96%  10/16/23 96%      Physical Exam Constitutional:      General: He is not in acute distress.    Appearance: Normal appearance. He is not ill-appearing.  HENT:     Head: Normocephalic and atraumatic.     Right Ear: External ear normal.     Left Ear: External ear normal.     Nose: Nose normal.  Eyes:     Conjunctiva/sclera: Conjunctivae normal.  Cardiovascular:     Rate and Rhythm: Normal rate and regular rhythm.     Pulses: Normal pulses.     Heart sounds: Normal heart sounds. No murmur heard.    No friction rub.  Pulmonary:     Effort: Pulmonary effort is normal. No respiratory distress.     Breath sounds: Normal  breath sounds. No wheezing, rhonchi or rales.  Musculoskeletal:     Comments: Left wrist: Ulnar-sided wrist pain with bony tenderness.  Good peripheral perfusion with cap refill less than 2 seconds.  Skin:    General: Skin is warm and dry.     Coloration: Skin is not jaundiced or pale.  Neurological:     Mental Status: He is alert.  Psychiatric:        Mood and Affect: Mood normal.        Behavior: Behavior normal.      No results found for any visits on 01/07/24.  Last CBC Lab Results  Component Value Date   WBC 7.9 11/19/2023   HGB 13.6 11/19/2023   HCT 42.6 11/19/2023   MCV 89 11/19/2023   MCH 28.5 11/19/2023  RDW 14.2 11/19/2023   PLT 259 11/19/2023   Last metabolic panel Lab Results  Component Value Date   GLUCOSE 183 (H) 11/19/2023   NA 142 11/19/2023   K 4.9 11/19/2023   CL 105 11/19/2023   CO2 18 (L) 11/19/2023   BUN 18 11/19/2023   CREATININE 1.16 11/19/2023   EGFR 73 11/19/2023   CALCIUM  9.6 11/19/2023   PROT 7.5 11/19/2023   ALBUMIN 4.6 11/19/2023   LABGLOB 2.9 11/19/2023   AGRATIO 1.8 10/25/2021   BILITOT 0.3 11/19/2023   ALKPHOS 131 (H) 11/19/2023   AST 24 11/19/2023   ALT 15 11/19/2023   ANIONGAP 4 (L) 09/07/2021   Last lipids Lab Results  Component Value Date   CHOL 196 11/19/2023   HDL 44 11/19/2023   LDLCALC 114 (H) 11/19/2023   TRIG 219 (H) 11/19/2023   CHOLHDL 4.5 11/19/2023   Last hemoglobin A1c Lab Results  Component Value Date   HGBA1C 7.9 (H) 11/19/2023      The ASCVD Risk score (Arnett DK, et al., 2019) failed to calculate for the following reasons:   Risk score cannot be calculated because patient has a medical history suggesting prior/existing ASCVD   * - Cholesterol units were assumed    Assessment & Plan:   Assessment and Plan    Right wrist injury after fall FOOSH injury.  Acute right wrist pain following a fall on an outstretched hand, localized to the ulnar side, likely involving bony structures.  Concern for  TFCC tear, ECU tear, or simple contusion.  No immediate need for x-ray unless symptoms persist or worsen. - Ordered x-ray of right wrist - Rx for meloxicam  7.5 mg daily for the next couple weeks. - Advised to monitor for numbness, tingling, or changes in blood flow - Will refer to orthopedist if symptoms persist or worsen  Bicipital tendinitis, right shoulder Residual tenderness on the outside and inside of the shoulder. No acute intervention required.  Type 2 diabetes mellitus Well-controlled with recent medication change to Zepbound, aiding in weight loss and improved glucose control. - Continue GLP-1 for diabetes management - [facility]  Obstructive sleep apnea Severe obstructive sleep apnea with previous CPAP intolerance. Scheduled for re-evaluation and potential alternative treatments, including Inspire hypoglossal nerve stimulator. - Proceed with sleep apnea re-evaluation - Discuss Inspire hypoglossal nerve stimulator with sleep specialist  Depression Managed with Zoloft and Lexapro. Potential medication duplication requires clarification. - Verify current antidepressant regimen with pharmacy - Ensure only one antidepressant is being taken  General health maintenance Recent tdap vaccination well-tolerated. Blood pressure rechecked during visit. - Continue routine health maintenance - Rechecked blood pressure      Return if symptoms worsen or fail to improve.    Heavenleigh Petruzzi T Scot Shiraishi, PA-C     [1]  Social History Tobacco Use   Smoking status: Never    Passive exposure: Never   Smokeless tobacco: Never  Vaping Use   Vaping status: Never Used  Substance Use Topics   Alcohol use: No   Drug use: Not Currently  [2]  Allergies Allergen Reactions   Doxycycline Itching, Other (See Comments), Rash and Hives    flushing  ITCHING   Penicillins Itching and Rash    ITCHING   Bee Venom Swelling   "

## 2024-01-07 NOTE — Patient Instructions (Signed)
 It was nice to see you today!  As we discussed in clinic:  You can ask your sleep doctor at the [facility] about the Inspire device for treating sleep apnea.  If you have any problems before your next visit feel free to message me via MyChart (minor issues or questions) or call the office, otherwise you may reach out to schedule an office visit.  Thank you! Abou Sterkel, PA-C

## 2024-03-20 ENCOUNTER — Encounter (HOSPITAL_BASED_OUTPATIENT_CLINIC_OR_DEPARTMENT_OTHER): Admitting: Student

## 2024-04-03 ENCOUNTER — Ambulatory Visit (HOSPITAL_BASED_OUTPATIENT_CLINIC_OR_DEPARTMENT_OTHER): Admitting: Student
